# Patient Record
Sex: Male | Born: 1965 | Race: Black or African American | Hispanic: No | Marital: Single | Smoking: Former smoker
Health system: Southern US, Community
[De-identification: ages and names within clinical notes are randomized; demographics above are authoritative.]

## PROBLEM LIST (undated history)

## (undated) DIAGNOSIS — I10 Essential (primary) hypertension: Secondary | ICD-10-CM

## (undated) DIAGNOSIS — F1910 Other psychoactive substance abuse, uncomplicated: Secondary | ICD-10-CM

## (undated) DIAGNOSIS — Z720 Tobacco use: Secondary | ICD-10-CM

---

## 2021-04-16 ENCOUNTER — Emergency Department (HOSPITAL_COMMUNITY): Payer: Self-pay

## 2021-04-16 ENCOUNTER — Observation Stay (HOSPITAL_COMMUNITY)
Admission: EM | Admit: 2021-04-16 | Discharge: 2021-04-17 | Disposition: A | Discharge status: Home / Self Care | Payer: Self-pay | Attending: Internal Medicine | Admitting: Internal Medicine

## 2021-04-16 ENCOUNTER — Encounter (HOSPITAL_COMMUNITY): Payer: Self-pay | Admitting: Emergency Medicine

## 2021-04-16 ENCOUNTER — Other Ambulatory Visit: Payer: Self-pay

## 2021-04-16 DIAGNOSIS — I160 Hypertensive urgency: Principal | ICD-10-CM | POA: Insufficient documentation

## 2021-04-16 DIAGNOSIS — I10 Essential (primary) hypertension: Secondary | ICD-10-CM | POA: Insufficient documentation

## 2021-04-16 DIAGNOSIS — R079 Chest pain, unspecified: Secondary | ICD-10-CM

## 2021-04-16 DIAGNOSIS — I208 Other forms of angina pectoris: Secondary | ICD-10-CM | POA: Insufficient documentation

## 2021-04-16 HISTORY — DX: Other psychoactive substance abuse, uncomplicated: F19.10

## 2021-04-16 HISTORY — DX: Essential (primary) hypertension: I10

## 2021-04-16 HISTORY — DX: Tobacco use: Z72.0

## 2021-04-16 LAB — TROPONIN I (HIGH SENSITIVITY)
Troponin I (High Sensitivity): 20 ng/L — ABNORMAL HIGH (ref <18)
Troponin I (High Sensitivity): 22 ng/L — ABNORMAL HIGH (ref <18)

## 2021-04-16 LAB — CBC
HCT: 42.6 % (ref 39.0–52.0)
Hemoglobin: 13.9 g/dL (ref 13.0–17.0)
MCH: 29.4 pg (ref 26.0–34.0)
MCHC: 32.6 g/dL (ref 30.0–36.0)
MCV: 90.1 fL (ref 80.0–100.0)
Platelets: 244 10*3/uL (ref 150–400)
RBC: 4.73 MIL/uL (ref 4.22–5.81)
RDW: 11.9 % (ref 11.5–15.5)
WBC: 5.8 10*3/uL (ref 4.0–10.5)
nRBC: 0 % (ref 0.0–0.2)

## 2021-04-16 LAB — BASIC METABOLIC PANEL
Anion gap: 11 (ref 5–15)
BUN: 8 mg/dL (ref 6–20)
CO2: 23 mmol/L (ref 22–32)
Calcium: 9.2 mg/dL (ref 8.9–10.3)
Chloride: 104 mmol/L (ref 98–111)
Creatinine, Ser: 1.04 mg/dL (ref 0.61–1.24)
GFR, Estimated: >60 mL/min (ref >60)
Glucose, Bld: 150 mg/dL — ABNORMAL HIGH (ref 70–99)
Potassium: 3.4 mmol/L — ABNORMAL LOW (ref 3.5–5.1)
Sodium: 138 mmol/L (ref 135–145)

## 2021-04-16 LAB — RAPID URINE DRUG SCREEN, HOSP PERFORMED
Amphetamines: NOT DETECTED
Barbiturates: NOT DETECTED
Benzodiazepines: NOT DETECTED
Cocaine: POSITIVE — AB
Opiates: NOT DETECTED
Tetrahydrocannabinol: NOT DETECTED

## 2021-04-16 LAB — LIPID PANEL
Cholesterol: 239 mg/dL — ABNORMAL HIGH (ref 0–200)
HDL: 52 mg/dL (ref >40)
LDL Cholesterol: 176 mg/dL — ABNORMAL HIGH (ref 0–99)
Total CHOL/HDL Ratio: 4.6 RATIO
Triglycerides: 56 mg/dL (ref <150)
VLDL: 11 mg/dL (ref 0–40)

## 2021-04-16 LAB — TSH: TSH: 1.116 u[IU]/mL (ref 0.350–4.500)

## 2021-04-16 LAB — PROTIME-INR
INR: 1 (ref 0.8–1.2)
Prothrombin Time: 12.9 seconds (ref 11.4–15.2)

## 2021-04-16 LAB — HEMOGLOBIN A1C
Hgb A1c MFr Bld: 4.7 % — ABNORMAL LOW (ref 4.8–5.6)
Mean Plasma Glucose: 88.19 mg/dL

## 2021-04-16 LAB — HIV ANTIBODY (ROUTINE TESTING W REFLEX): HIV Screen 4th Generation wRfx: NONREACTIVE

## 2021-04-16 LAB — HEPARIN LEVEL (UNFRACTIONATED): Heparin Unfractionated: <0.1 IU/mL — ABNORMAL LOW (ref 0.30–0.70)

## 2021-04-16 MED ORDER — HEPARIN BOLUS VIA INFUSION
4000.0000 [IU] | Freq: Once | INTRAVENOUS | Status: AC
  Administered 2021-04-16: 4000 [IU] via INTRAVENOUS
  Filled 2021-04-16: qty 4000

## 2021-04-16 MED ORDER — ACETAMINOPHEN 650 MG RE SUPP: 650.0000 mg | Freq: Four times a day (QID) | RECTAL | Status: DC | PRN

## 2021-04-16 MED ORDER — SENNOSIDES-DOCUSATE SODIUM 8.6-50 MG PO TABS: 1.0000 | ORAL_TABLET | Freq: Every evening | ORAL | Status: DC | PRN

## 2021-04-16 MED ORDER — POTASSIUM CHLORIDE CRYS ER 20 MEQ PO TBCR
40.0000 meq | EXTENDED_RELEASE_TABLET | Freq: Once | ORAL | Status: AC
  Administered 2021-04-16: 40 meq via ORAL
  Filled 2021-04-16: qty 2

## 2021-04-16 MED ORDER — HEPARIN (PORCINE) 25000 UT/250ML-% IV SOLN
1200.0000 [IU]/h | INTRAVENOUS | Status: DC
Start: 2021-04-16 — End: 2021-04-16
  Administered 2021-04-16: 1200 [IU]/h via INTRAVENOUS
  Filled 2021-04-16: qty 250

## 2021-04-16 MED ORDER — DILTIAZEM HCL 30 MG PO TABS
30.0000 mg | ORAL_TABLET | Freq: Four times a day (QID) | ORAL | Status: DC
  Administered 2021-04-16 – 2021-04-17 (×4): 30 mg via ORAL
  Filled 2021-04-16 (×5): qty 1

## 2021-04-16 MED ORDER — METOPROLOL TARTRATE 25 MG PO TABS: 25.0000 mg | ORAL_TABLET | Freq: Four times a day (QID) | ORAL | Status: DC

## 2021-04-16 MED ORDER — AMLODIPINE BESYLATE 5 MG PO TABS
10.0000 mg | ORAL_TABLET | Freq: Once | ORAL | Status: AC
  Administered 2021-04-16: 10 mg via ORAL
  Filled 2021-04-16: qty 2

## 2021-04-16 MED ORDER — ACETAMINOPHEN 325 MG PO TABS: 650.0000 mg | ORAL_TABLET | Freq: Four times a day (QID) | ORAL | Status: DC | PRN

## 2021-04-16 NOTE — Progress Notes (Signed)
ANTICOAGULATION CONSULT NOTE - Initial Consult ? ?Pharmacy Consult for Heparin ?Indication: chest pain/ACS ? ?Allergies  ?Allergen Reactions  ? Lactose Intolerance (Gi) Diarrhea  ? ? ?Patient Measurements: ?Height: 5\' 8"  (172.7 cm) ?Weight: 86.2 kg (190 lb) ?IBW/kg (Calculated) : 68.4 ?Heparin Dosing Weight: 85.7 kg ? ?Vital Signs: ?Temp: 97.6 ?F (36.4 ?C) (03/26 0850) ?Temp Source: Oral (03/26 0850) ?BP: 192/109 (03/26 1100) ?Pulse Rate: 86 (03/26 1100) ? ?Labs: ?Recent Labs  ?  04/16/21 ?0856 04/16/21 ?1022  ?HGB 13.9  --   ?HCT 42.6  --   ?PLT 244  --   ?CREATININE 1.04  --   ?TROPONINIHS 20* 22*  ? ? ?Estimated Creatinine Clearance: 86.7 mL/min (by C-G formula based on SCr of 1.04 mg/dL). ? ? ?Medical History: ?Past Medical History:  ?Diagnosis Date  ? Hypertension   ? ? ?Medications:  ?(Not in a hospital admission) ? ?Scheduled:  ? metoprolol tartrate  25 mg Oral Q6H  ? ?Infusions:  ?PRN:  ? ?Assessment: ?30 yom with a history of hypertension, tobacco and marijuana use . Patient is presenting with chest pain. Heparin per pharmacy consult placed for chest pain/ACS. ? ?Patient is not on anticoagulation prior to arrival. ? ?Hgb 13.9; plt 244 ? ?Goal of Therapy:  ?Heparin level 0.3-0.7 units/ml ?Monitor platelets by anticoagulation protocol: Yes ?  ?Plan:  ?Give 4000 units bolus x 1 ?Start heparin infusion at 1200 units/hr ?Check anti-Xa level in 6 hours and daily while on heparin ?Continue to monitor H&H and platelets ? ?Lorelei Pont, PharmD, BCPS ?04/16/2021 11:44 AM ?ED Clinical Pharmacist -  [phone] ? ? ? ?

## 2021-04-16 NOTE — H&P (Addendum)
? ?Date: 04/16/2021     ?Patient Name:  Philip Roth MRN: [id number]  ?DOB: [birth year] Age / Sex: 56 y.o., male   ?PCP: Pcp, No    ?     ?Medical Service: Internal Medicine Teaching Service  ?     ?Attending Physician: Dr. Mercie EonMachen, Julie, MD    ?Intern (1st Contact): Dr. Alfonse Flavorsosby Pager: [phone]  ?Resident (2nd Contact): Merrilyn PumaSagar Jinwala, MD Pager: Nada MaclachlanSJ [phone]  ?     ?After Hours (After 5p/  First Contact Pager: [phone]  ?weekends / holidays): Second Contact Pager: [phone]  ? ?SUBJECTIVE:  ?Chief Complaint: Chest and Left Arm Pain ? ?History of Present Illness: Philip Jacobsyrone Roth is a  56 y.o. male with a pertinent PMH of hypertension and hyperlipidemia, who presents to [facility] with chest and left arm pain. ? ?Philip Roth states that he was in his usual state of health until he began to experience chest tightness with radiation and "tightness" to left arm for the last week.  He states that the symptoms occur from the seated to standing position and typically occur twice a day.  He states that the symptoms do not occur when he is walking long distances. He denies any lightheadedness/dizziness, nausea, vomiting, dyspnea, or abdominal pain during these episodes, and his symptoms typically last up to a minute or 2.  He states that he has never had episodes like this.  He has not tried anything to alleviate the pain, but finds resting beneficial. The only that exacerbates his pain when he stands from a seated position. He denies having the symptoms in the past. ? ?Patient states that he does have a past medical history of hypertension and hyperlipidemia, but has not been compliant with medication for months.  He is unsure what medication is but he believes that his cholesterol medication was HCTZ. ? ?In the ED, the patient was patient was found to be hypertensive with systolics in the 170s, with no tachycardia.  Initial troponin was 20 with a second pending.  UDS pending.  CBC was reassuring, and he was mildly hypokalemic with a  potassium of 3.4.  Patient was started on amlodipine in the emergency department, and I MTS was consulted for admission.  EKG shows severe hypertrophy, and cardiology was consulted in the ED. ? ?Medications: ?No current facility-administered medications on file prior to encounter.  ? ?No current outpatient medications on file prior to encounter.  ? ? ?Past Medical History: ?Past Medical History:  ?Diagnosis Date  ? Hypertension   ? ? ?Social:  ?Lives - [address] ?[place],  [place] [id number],  ?Support -Lives at home alone ?Level of function: He is able to complete all ADLs and IADLs dependently ?Occupation -works at a [employer] ?PCP - Pcp, No ?Substance use: ?Nonprescription/Illicit -occasional marijuana use, snorts cocaine twice weekly, last use yesterday ?ETOH -None ?Tobacco -Current 5-pack-year smoker ? ?Family History: ?Hypertension: Mother ?Diabetes: Mother ?Heart disease: Father ?Stroke: Father ?Cancer: Uncles (prostate) ? ? ?Allergies: ?Allergies as of 04/16/2021 - Review Complete 04/16/2021  ?Allergen Reaction Noted  ? Lactose intolerance (gi) Diarrhea 04/16/2021  ? ? ?Review of Systems: ?A complete ROS was negative except as per HPI.  ? ?OBJECTIVE:  ?Physical Exam: ?Blood pressure (!) 192/109, pulse 86, temperature 97.6 ?F (36.4 ?C), temperature source Oral, resp. rate 17, height 5\' 8"  (1.727 m), weight 86.2 kg, SpO2 100 %. ?Physical Exam ?Vitals reviewed.  ?Constitutional:   ?   General: He is not in acute distress. ?   Appearance:  He is normal weight. He is not ill-appearing.  ?   Comments: Resting comfortably in bed, no acute distress, answers questions appropriately  ?Cardiovascular:  ?   Rate and Rhythm: Normal rate.  ?   Pulses: Normal pulses.  ?   Heart sounds: Normal heart sounds. No murmur heard. ?  No friction rub. No gallop.  ?Pulmonary:  ?   Effort: Pulmonary effort is normal.  ?   Breath sounds: Normal breath sounds. No wheezing, rhonchi or rales.  ?Abdominal:  ?   General: Bowel  sounds are normal. There is no distension.  ?Musculoskeletal:  ?   Right lower leg: No edema.  ?   Left lower leg: No edema.  ?Neurological:  ?   Mental Status: He is alert and oriented to person, place, and time.  ?Psychiatric:     ?   Behavior: Behavior normal.  ? ? ?Pertinent Labs: ?CBC ?   ?Component Value Date/Time  ? WBC 5.8 04/16/2021 0856  ? RBC 4.73 04/16/2021 0856  ? HGB 13.9 04/16/2021 0856  ? HCT 42.6 04/16/2021 0856  ? PLT 244 04/16/2021 0856  ? MCV 90.1 04/16/2021 0856  ? MCH 29.4 04/16/2021 0856  ? MCHC 32.6 04/16/2021 0856  ? RDW 11.9 04/16/2021 0856  ?  ? ?CMP  ?   ?Component Value Date/Time  ? NA 138 04/16/2021 0856  ? K 3.4 (L) 04/16/2021 0856  ? CL 104 04/16/2021 0856  ? CO2 23 04/16/2021 0856  ? GLUCOSE 150 (H) 04/16/2021 0856  ? BUN 8 04/16/2021 0856  ? CREATININE 1.04 04/16/2021 0856  ? CALCIUM 9.2 04/16/2021 0856  ? GFRNONAA >60 04/16/2021 0856  ? ? ?Pertinent Imaging: ?DG Chest 2 View ? ?Result Date: 04/16/2021 ?CLINICAL DATA:  Pt reports chest tightness, L arm tightness, and generalized weakness x 2 days. Denies SOB, nausea, and vomiting EXAM: CHEST - 2 VIEW COMPARISON:  None. FINDINGS: Normal heart, mediastinum and hila. Clear lungs.  No pleural effusion or pneumothorax. Skeletal structures are intact. IMPRESSION: No active cardiopulmonary disease. Electronically Signed   By: Amie Portland M.D.   On: 04/16/2021 09:59   ? ?EKG: Tachycardic with left ventricular hypertrophy ? ?ASSESSMENT & PLAN:  ?Patient Summary: ?56 year old male with past medical history of hypertension hyperlipidemia presenting for chest pain, admitted for hypertensive urgency management. ? ?Assessment: ?Principal Problem: ?  Hypertensive urgency ? ? ?Plan: ?#Hypertensive Urgency in the Setting of Cocaine Use and Noncompliance with Home Medications ?#Cocaine Use ?#Atypical Chest Pain: ?Patient presents with chest pain that worsens from sitting to standing for the past week. Initial troponin is 20 with repeat of 22.   Systolic pressures in the 170s to 190s, with positive drug screen for cocaine and noncompliance to medications per month likely contributing. Cardiology was consulted in the ED. He was started on a heparin drip and metoprolol was ordered.  Given the positive drug screen, BBs are contraindicated. Spoke with Dr. Graciela Husbands about the patient's presentation, and his symptoms aligns more with atypical chest pain and angina. ?- Appreciate cardiology recommendations ?- Avoid beta-blockers in the setting of positive cocaine UDS ?- Obtain echo ?- Admit to progressive with cardiac monitoring ?- Started on Cardizem 30 mg every 6 hours ?- TSH ?- Recommended cessation of cocaine ? ?#Hyperlipidemia: ?Patient states that he has a history of hyperlipidemia movement compliant with his medications.  He is unsure what medication he is taking in the outpatient setting.  Given history of hyperlipidemia and hypertension, will additionally screen for A1c. ?-  Lipid panel ?- A1c ? ? ?Best Practice: ?Diet: Cardiac diet ? ?VTE: heparin bolus via infusion 4,000 Units Start: 04/16/21 1200 ?Code: Full Code ?Dispo: Observation with expected length of stay less than 2 midnights. ?Anticipated Discharge Location: Home ?Barriers to Discharge: Medical stability ? ?Signature: ?Dolan Amen, MD ?Internal Medicine Resident, PGY-3 ?[facility]  ?11:54 AM, 04/16/2021  ? ?Please contact the on-call pager after 5 pm and on weekends at [phone].  ?

## 2021-04-16 NOTE — ED Triage Notes (Signed)
Pt reports chest tightness, L arm tightness, and generalized weakness x 2 days.  Denies SOB, nausea, and vomiting. ?

## 2021-04-16 NOTE — ED Notes (Signed)
Cardiologist at bedside.  

## 2021-04-16 NOTE — Consult Note (Addendum)
?Cardiology Consultation:  ? ?Patient ID: Philip Roth ?MRN: [id number]; DOB: [birth year] ? ?Admit date: 04/16/2021 ?Date of Consult: 04/16/2021 ? ?PCP:  Pcp, No ?  ?[facility] Providers ?Cardiologist:  New ? ?Patient Profile:  ? ?Philip Roth is a 56 y.o. male with a hx of hypertension (noncompliant) and polysubstance abuse (tobacco, marijuana and cocaine) who is being seen 04/16/2021 for the evaluation of chest pain at the request of Dr. Lafonda Mosses. ? ?History of Present Illness:  ? ?Mr. Goeken has not been taking his antihypertensive for months.  Reports 1 week history of intermittent chest pain across his chest.  Describes sharp pressure in character.  Reports left arm tightness.  Occasional shortness of breath or palpitation.  Nothing makes better or worse.  Smokes 1 pack of cigarette every day.  Occasional marijuana smoking.  Last use of cocaine yesterday.  No orthopnea, PND, syncope, dizziness or melena. ? ?On arrival to emergency room, patient found hypertensive at 199/109.  Given amlodipine 10 mg. ? ?High-sensitivity troponin 20>> 22. ?Potassium 3.4 ?Glucose 150 ?Creatinine normal ?Urine drug screen positive for cocaine ?Chest x-ray without acute cardiopulmonary disease ? ?Past Medical History:  ?Diagnosis Date  ? Hypertension   ? Polysubstance abuse (HCC)   ? Tobacco abuse   ? ? ?History reviewed. No pertinent surgical history.  ? ?Inpatient Medications: ?Scheduled Meds: ? diltiazem  30 mg Oral Q6H  ? metoprolol tartrate  25 mg Oral Q6H  ? ?Continuous Infusions: ? heparin 1,200 Units/hr (04/16/21 1332)  ? ?PRN Meds: ?acetaminophen **OR** acetaminophen, senna-docusate ? ?Allergies:    ?Allergies  ?Allergen Reactions  ? Lactose Intolerance (Gi) Diarrhea  ? ? ?Social History:   ?Social History  ? ?Socioeconomic History  ? Marital status: Single  ?  Spouse name: Not on file  ? Number of children: Not on file  ? Years of education: Not on file  ? Highest education level: Not on file  ?Occupational History  ?  Not on file  ?Tobacco Use  ? Smoking status: Every Day  ?  Types: Cigarettes  ? Smokeless tobacco: Never  ?Substance and Sexual Activity  ? Alcohol use: Not Currently  ? Drug use: Yes  ?  Types: Marijuana  ? Sexual activity: Not on file  ?Other Topics Concern  ? Not on file  ?Social History Narrative  ? Not on file  ? ?Social Determinants of Health  ? ?Financial Resource Strain: Not on file  ?Food Insecurity: Not on file  ?Transportation Needs: Not on file  ?Physical Activity: Not on file  ?Stress: Not on file  ?Social Connections: Not on file  ?Intimate Partner Violence: Not on file  ?  ?Family History:   ?Family History  ?Problem Relation Age of Onset  ? Stroke Mother   ?  ? ?ROS:  ?Please see the history of present illness.  ?All other ROS reviewed and negative.    ? ?Physical Exam/Data:  ? ?Vitals:  ? 04/16/21 1245 04/16/21 1300 04/16/21 1315 04/16/21 1335  ?BP: (!) 180/105 (!) 187/112 (!) 180/97   ?Pulse: 82 77 66   ?Resp: 19 13 15    ?Temp:    97.8 ?F (36.6 ?C)  ?TempSrc:    Oral  ?SpO2: 99% 99% 98%   ?Weight:      ?Height:      ? ?No intake or output data in the 24 hours ending 04/16/21 1340 ? ?  04/16/2021  ? 10:03 AM  ?Last 3 Weights  ?Weight (lbs) 190 lb  ?Weight (kg)  86.183 kg  ?   ?Body mass index is 28.89 kg/m?.  ?General:  Well nourished, well developed, in no acute distress ?HEENT: normal ?Neck: no JVD ?Vascular: No carotid bruits; Distal pulses 2+ bilaterally ?Cardiac:  normal S1, S2; RRR; no murmur  ?Lungs:  clear to auscultation bilaterally, no wheezing, rhonchi or rales  ?Abd: soft, nontender, no hepatomegaly  ?Ext: no edema ?Musculoskeletal:  No deformities, BUE and BLE strength normal and equal ?Skin: warm and dry  ?Neuro:  CNs 2-12 intact, no focal abnormalities noted ?Psych:  Normal affect  ? ?EKG:  The EKG was personally reviewed and demonstrates: Sinus tachycardia, heart rate 109 bpm, VH, repolarization abnormality ?Telemetry:  Telemetry was personally reviewed and demonstrates:  Sinus rhythm  at controlled rate  ? ?Relevant CV Studies: ?As above  ? ?Laboratory Data: ? ?High Sensitivity Troponin:   ?Recent Labs  ?Lab 04/16/21 ?0856 04/16/21 ?1022  ?TROPONINIHS 20* 22*  ?   ?Chemistry ?Recent Labs  ?Lab 04/16/21 ?0856  ?NA 138  ?K 3.4*  ?CL 104  ?CO2 23  ?GLUCOSE 150*  ?BUN 8  ?CREATININE 1.04  ?CALCIUM 9.2  ?GFRNONAA >60  ?ANIONGAP 11  ?  ?Hematology ?Recent Labs  ?Lab 04/16/21 ?0856  ?WBC 5.8  ?RBC 4.73  ?HGB 13.9  ?HCT 42.6  ?MCV 90.1  ?MCH 29.4  ?MCHC 32.6  ?RDW 11.9  ?PLT 244  ? ? ?Radiology/Studies:  ?DG Chest 2 View ? ?Result Date: 04/16/2021 ?CLINICAL DATA:  Pt reports chest tightness, L arm tightness, and generalized weakness x 2 days. Denies SOB, nausea, and vomiting EXAM: CHEST - 2 VIEW COMPARISON:  None. FINDINGS: Normal heart, mediastinum and hila. Clear lungs.  No pleural effusion or pneumothorax. Skeletal structures are intact. IMPRESSION: No active cardiopulmonary disease. Electronically Signed   By: Amie Portland M.D.   On: 04/16/2021 09:59   ? ? ?Assessment and Plan:  ? ?Chest pain in setting of hypertensive urgency and cocaine use ?-His symptoms atypical of angina.  Troponin flat, not in ACS pattern. ?-Currently patient does not need IV heparin ?-Cycle troponin, if significantly elevated will start heparin ?-Avoid beta-blocker in setting of cocaine use ?-Get echocardiogram ?-Check lipid panel and hemoglobin A1c ?-Start aspirin 81 mg daily ?-Start low-dose statin ? ?2.  Hypertensive urgency ?-Noncompliant with antihypertensive ?-Will start Cardizem 30 mg every 6 hours for immediate response ?-Continue amlodipine for now ?-Avoid beta-blocker secondary to cocaine use ? ?3.  Polysubstance abuse ?-Recommended cessation ? ?4.  Hypokalemia ?-Potassium 3.4 ?-Will give Kdur x 1  ? ? ?Risk Assessment/Risk Scores:  ? ?HEAR Score (for undifferentiated chest pain):  HEAR Score: 2{ ? ? ?For questions or updates, please contact [facility] ?Please consult [email or web address] for contact info under   ? ? ?Signed, ?Manson Passey, [place]  ?04/16/2021 1:40 PM  ? ? ?Chest pain atypical-- suspect musculoskeletal ? ?Hypertensive urgency ? ?Polysubstance abuse ? ?ECG showing left ventricular hypertrophy with repolarization abnormalities-- ? ?The patient's chest pain is atypical.  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  Pain is aggravated by forced adduction of his right arm and his left arm across his chest.  There are some chest wall tenderness.  Aggravated by sitting up or lying down, the motion as opposed to the position. ? ?This is in line with his troponins.  With his cocaine history, would avoid beta-blockers.  Do not think he needs nitroglycerin. ? ?With his abnormal electrocardiogram, would obtain  an echocardiogram to make sure he does not have hypertrophic cardiomyopathy ? ?Encourage compliance with his blood pressure medications ? ?We will sign off and we are available again as needed ? ? ? ?

## 2021-04-16 NOTE — ED Provider Notes (Addendum)
?[facility] EMERGENCY DEPARTMENT ?Provider Note ? ? ?CSN: [id number] ?Arrival date & time: 04/16/21  0845 ? ?  ? ?History ? ?Chief Complaint  ?Patient presents with  ? Chest Pain  ? ? ?Glyn Gerads is a 56 y.o. male. ? ?Patient is a 56 year old male with a history of hypertension, tobacco and marijuana use who has not taken blood pressure medications in quite some time presenting today with chest pain.  Patient reports for the last 3 days he has been getting intermittent chest pain seems to occur with exertion.  He reports that when he is up walking around which he has been doing a lot recently he will get a heavy discomfort in his chest that has started radiating into his left shoulder and back and then today and went down into his left arm.  He feels like he is going to pass out when that the chest pain comes on and sometimes takes a few hours to go away.  The last episode he had of chest pain was this morning which is the one that went into his left arm.  He has been pain-free for at least 30 to 40 minutes.  He is already taken 3 baby aspirin this morning.  He denies cough, congestion, fever ? ?The history is provided by the patient.  ?Chest Pain ? ?  ? ?Home Medications ?Prior to Admission medications   ?Not on File  ?   ? ?Allergies    ?Milk-related compounds   ? ?Review of Systems   ?Review of Systems  ?Cardiovascular:  Positive for chest pain.  ? ?Physical Exam ?Updated Vital Signs ?BP (!) 178/105   Pulse 81   Temp 97.6 ?F (36.4 ?C) (Oral)   Resp 19   Ht 5\' 8"  (1.727 m)   Wt 86.2 kg   SpO2 100%   BMI 28.89 kg/m?  ?Physical Exam ?Vitals and nursing note reviewed.  ?Constitutional:   ?   General: He is not in acute distress. ?   Appearance: He is well-developed.  ?HENT:  ?   Head: Normocephalic and atraumatic.  ?Eyes:  ?   Conjunctiva/sclera: Conjunctivae normal.  ?   Pupils: Pupils are equal, round, and reactive to light.  ?Cardiovascular:  ?   Rate and Rhythm: Normal rate and regular  rhythm.  ?   Pulses: Normal pulses.  ?   Heart sounds: No murmur heard. ?Pulmonary:  ?   Effort: Pulmonary effort is normal. No respiratory distress.  ?   Breath sounds: Normal breath sounds. No wheezing or rales.  ?Chest:  ?   Chest wall: No tenderness.  ?Abdominal:  ?   General: There is no distension.  ?   Palpations: Abdomen is soft.  ?   Tenderness: There is no abdominal tenderness. There is no guarding or rebound.  ?Musculoskeletal:     ?   General: No tenderness. Normal range of motion.  ?   Cervical back: Normal range of motion and neck supple.  ?Skin: ?   General: Skin is warm and dry.  ?   Findings: No erythema or rash.  ?Neurological:  ?   Mental Status: He is alert and oriented to person, place, and time. Mental status is at baseline.  ?Psychiatric:     ?   Mood and Affect: Mood normal.     ?   Behavior: Behavior normal.  ? ? ?ED Results / Procedures / Treatments   ?Labs ?(all labs ordered are listed, but only abnormal  results are displayed) ?Labs Reviewed  ?BASIC METABOLIC PANEL - Abnormal; Notable for the following components:  ?    Result Value  ? Potassium 3.4 (*)   ? Glucose, Bld 150 (*)   ? All other components within normal limits  ?TROPONIN I (HIGH SENSITIVITY) - Abnormal; Notable for the following components:  ? Troponin I (High Sensitivity) 20 (*)   ? All other components within normal limits  ?CBC  ?RAPID URINE DRUG SCREEN, HOSP PERFORMED  ?TROPONIN I (HIGH SENSITIVITY)  ? ? ?EKG ?EKG Interpretation ? ?Date/Time:  Sunday April 16 2021 08:39:18 EDT ?Ventricular Rate:  109 ?PR Interval:  144 ?QRS Duration: 94 ?QT Interval:  350 ?QTC Calculation: 471 ?R Axis:   53 ?Text Interpretation: Sinus tachycardia Left ventricular hypertrophy with repolarization abnormality ( Sokolow-Lyon , Romhilt-Estes ) No previous ECGs available Confirmed by Gwyneth Sprout ([id number]) on 04/16/2021 9:28:04 AM ? ?Radiology ?DG Chest 2 View ? ?Result Date: 04/16/2021 ?CLINICAL DATA:  Pt reports chest tightness, L arm  tightness, and generalized weakness x 2 days. Denies SOB, nausea, and vomiting EXAM: CHEST - 2 VIEW COMPARISON:  None. FINDINGS: Normal heart, mediastinum and hila. Clear lungs.  No pleural effusion or pneumothorax. Skeletal structures are intact. IMPRESSION: No active cardiopulmonary disease. Electronically Signed   By: Amie Portland M.D.   On: 04/16/2021 09:59   ? ?Procedures ?Procedures  ? ? ?Medications Ordered in ED ?Medications  ?amLODipine (NORVASC) tablet 10 mg (10 mg Oral Given 04/16/21 1036)  ? ? ?ED Course/ Medical Decision Making/ A&P ?  ?                        ?Medical Decision Making ?Amount and/or Complexity of Data Reviewed ?Labs: ordered. Decision-making details documented in ED Course. ?Radiology: ordered and independent interpretation performed. Decision-making details documented in ED Course. ?ECG/medicine tests: ordered and independent interpretation performed. Decision-making details documented in ED Course. ? ?Risk ?Prescription drug management. ?Decision regarding hospitalization. ? ? ?Patient is a 56 year old male presenting today with symptoms concerning for stable angina.  The symptoms have been present for the last 3 days in the setting of untreated hypertension, tobacco and marijuana use.  Patient has not seen a doctor regularly and sounds like he is most likely been off medication for approximately 4 years.  He has no prior heart history.  Currently he is pain-free and in no acute distress but continues to be hypertensive.  I independently interpreted patient's EKG and labs.  His EKG today shows mild sinus tachycardia with evidence of LVH but no frank ST elevation consistent with STEMI.  CBC, BMP are within normal limits and troponin is mildly elevated at 20.  I independently visualized and interpreted patient's chest x-ray which is negative.  Patient remains pain-free.  He was given oral amlodipine and he is already taken aspirin today.  Given patient's risk factors and concerning  history feel that he needs admission for chest pain work-up and blood pressure control.  Spoke with internal medicine resident for admission.  We will also consult cardiology. ? ?11:22 AM ?Spoke with cardiology who recommended that we go ahead and start heparin drip and also recommended metoprolol.  They feel that the patient will most likely need catheterization but does need blood pressure control in the meantime.  They are happy to consult on him. ?Patient was on continuous monitoring throughout his stay without evidence of dysrhythmia.  He remains pain-free at this time. ? ? ?CRITICAL CARE ?Performed by:  Aryan Bello ?Total critical care time: 30 minutes ?Critical care time was exclusive of separately billable procedures and treating other patients. ?Critical care was necessary to treat or prevent imminent or life-threatening deterioration. ?Critical care was time spent personally by me on the following activities: development of treatment plan with patient and/or surrogate as well as nursing, discussions with consultants, evaluation of patient's response to treatment, examination of patient, obtaining history from patient or surrogate, ordering and performing treatments and interventions, ordering and review of laboratory studies, ordering and review of radiographic studies, pulse oximetry and re-evaluation of patient's condition. ? ? ? ? ? ?Final Clinical Impression(s) / ED Diagnoses ?Final diagnoses:  ?Stable angina (HCC)  ?Hypertensive urgency  ? ? ?Rx / DC Orders ?ED Discharge Orders   ? ? None  ? ?  ? ? ?  ?Gwyneth SproutPlunkett, Eduar Kumpf, MD ?04/16/21 1059 ? ?  ?Gwyneth SproutPlunkett, Ceasar Decandia, MD ?04/16/21 1123 ? ?

## 2021-04-17 ENCOUNTER — Other Ambulatory Visit (HOSPITAL_COMMUNITY): Payer: Self-pay

## 2021-04-17 ENCOUNTER — Observation Stay (HOSPITAL_BASED_OUTPATIENT_CLINIC_OR_DEPARTMENT_OTHER): Payer: Self-pay

## 2021-04-17 DIAGNOSIS — I160 Hypertensive urgency: Secondary | ICD-10-CM

## 2021-04-17 DIAGNOSIS — R079 Chest pain, unspecified: Secondary | ICD-10-CM

## 2021-04-17 DIAGNOSIS — I208 Other forms of angina pectoris: Secondary | ICD-10-CM

## 2021-04-17 LAB — ECHOCARDIOGRAM COMPLETE
Area-P 1/2: 2.76 cm2
Calc EF: 46.1 %
Height: 68 in
S' Lateral: 3.5 cm
Single Plane A2C EF: 46 %
Single Plane A4C EF: 48.1 %
Weight: 2299.2 oz

## 2021-04-17 LAB — BASIC METABOLIC PANEL
Anion gap: 3 — ABNORMAL LOW (ref 5–15)
BUN: 13 mg/dL (ref 6–20)
CO2: 26 mmol/L (ref 22–32)
Calcium: 9.2 mg/dL (ref 8.9–10.3)
Chloride: 109 mmol/L (ref 98–111)
Creatinine, Ser: 1.23 mg/dL (ref 0.61–1.24)
GFR, Estimated: >60 mL/min (ref >60)
Glucose, Bld: 84 mg/dL (ref 70–99)
Potassium: 4.1 mmol/L (ref 3.5–5.1)
Sodium: 138 mmol/L (ref 135–145)

## 2021-04-17 LAB — CBC
HCT: 42.2 % (ref 39.0–52.0)
Hemoglobin: 13.9 g/dL (ref 13.0–17.0)
MCH: 29.8 pg (ref 26.0–34.0)
MCHC: 32.9 g/dL (ref 30.0–36.0)
MCV: 90.4 fL (ref 80.0–100.0)
Platelets: 229 10*3/uL (ref 150–400)
RBC: 4.67 MIL/uL (ref 4.22–5.81)
RDW: 12.1 % (ref 11.5–15.5)
WBC: 6 10*3/uL (ref 4.0–10.5)
nRBC: 0 % (ref 0.0–0.2)

## 2021-04-17 MED ORDER — ASPIRIN 81 MG PO CHEW
81.0000 mg | CHEWABLE_TABLET | Freq: Every day | ORAL | 0 refills | Status: DC
  Filled 2021-04-17: qty 30, 30d supply, fill #0

## 2021-04-17 MED ORDER — DILTIAZEM HCL 30 MG PO TABS
30.0000 mg | ORAL_TABLET | Freq: Four times a day (QID) | ORAL | Status: DC
  Administered 2021-04-17: 30 mg via ORAL
  Filled 2021-04-17: qty 1

## 2021-04-17 MED ORDER — DILTIAZEM HCL ER COATED BEADS 120 MG PO CP24
120.0000 mg | ORAL_CAPSULE | Freq: Every day | ORAL | 0 refills | Status: DC
  Filled 2021-04-17: qty 30, 30d supply, fill #0

## 2021-04-17 MED ORDER — DILTIAZEM HCL ER COATED BEADS 120 MG PO CP24: 120.0000 mg | ORAL_CAPSULE | Freq: Every day | ORAL | Status: DC

## 2021-04-17 MED ORDER — ASPIRIN 81 MG PO CHEW
81.0000 mg | CHEWABLE_TABLET | Freq: Every day | ORAL | Status: DC
  Administered 2021-04-17: 81 mg via ORAL
  Filled 2021-04-17: qty 1

## 2021-04-17 MED ORDER — ATORVASTATIN CALCIUM 10 MG PO TABS
20.0000 mg | ORAL_TABLET | Freq: Every day | ORAL | Status: DC
  Administered 2021-04-17: 20 mg via ORAL
  Filled 2021-04-17: qty 2

## 2021-04-17 MED ORDER — ATORVASTATIN CALCIUM 20 MG PO TABS
20.0000 mg | ORAL_TABLET | Freq: Every day | ORAL | 0 refills | Status: DC
  Filled 2021-04-17: qty 30, 30d supply, fill #0

## 2021-04-17 NOTE — TOC Transition Note (Addendum)
Transition of Care (TOC) - CM/SW Discharge Note ? ? ?Patient Details  ?Name: Satchel Heidinger ?MRN: [id number] ?Date of Birth: Mar 28, 1965 ? ?Transition of Care (TOC) CM/SW Contact:  ?Leone Haven, RN ?Phone Number: ?04/17/2021, 12:59 PM ? ? ?Clinical Narrative:    ?Patient is for dc today, TOC will fill meds for patient.  NCM will assist with Match Letter , patient states he does not have any funds. He states he has his car here at the hospital and will drive himself home today.  He has a follow up apt on the AVS. NCM gave patient SA resources.  ? ? ?  ?  ? ? ?Patient Goals and CMS Choice ?  ?  ?  ? ?Discharge Placement ?  ?           ?  ?  ?  ?  ? ?Discharge Plan and Services ?  ?  ?           ?  ?  ?  ?  ?  ?  ?  ?  ?  ?  ? ?Social Determinants of Health (SDOH) Interventions ?  ? ? ?Readmission Risk Interventions ?   ? View : No data to display.  ?  ?  ?  ? ? ? ? ? ?

## 2021-04-17 NOTE — TOC Progression Note (Signed)
Transition of Care (TOC) - Progression Note  ? ? ?Patient Details  ?Name: Philip Roth ?MRN: [id number] ?Date of Birth: [birth year] ? ?Transition of Care (TOC) CM/SW Contact  ?Zenon Mayo, RN ?Phone Number: ?04/17/2021, 10:51 AM ? ?Clinical Narrative:    ?from home, chest pain, htn urgency, noncompliant with meds, indep.   No insurance on file and no PCP on file.  Secretary to get patient schedule with  [facility] clinic.  He may need assist with meds at dc. TOC will continue to follow for dc needs.  ? ? ?  ?  ? ?Expected Discharge Plan and Services ?  ?  ?  ?  ?  ?                ?  ?  ?  ?  ?  ?  ?  ?  ?  ?  ? ? ?Social Determinants of Health (SDOH) Interventions ?  ? ?Readmission Risk Interventions ?   ? View : No data to display.  ?  ?  ?  ? ? ?

## 2021-04-17 NOTE — Discharge Summary (Signed)
? ?Name: Philip Roth ?MRN: [id number] ?DOB: 1965-11-22 56 y.o. ?PCP: Pcp, No ? ?Date of Admission: 04/16/2021  8:50 AM ?Date of Discharge: 04/17/2021 10:00 AM ?Attending Physician: Mercie Eon, MD ? ?Discharge Diagnosis: ?1. Hypertensive urgency, resolved ?2. Cocaine Use Disorder ?3. Medication non-compliance ?4. HLD ? ?Discharge Medications: ?Allergies as of 04/17/2021   ? ?   Reactions  ? Lactose Intolerance (gi) Diarrhea  ? ?  ? ?  ?Medication List  ?  ? ?STOP taking these medications   ? ?aspirin 325 MG EC tablet ?Replaced by: Aspirin Low Dose 81 MG chewable tablet ?  ? ?  ? ?TAKE these medications   ? ?Aspirin Low Dose 81 MG chewable tablet ?Generic drug: aspirin ?Chew 1 tablet (81 mg total) by mouth daily. ?Replaces: aspirin 325 MG EC tablet ?  ?atorvastatin 20 MG tablet ?Commonly known as: LIPITOR ?Take 1 tablet (20 mg total) by mouth daily. ?  ?Cartia XT 120 MG 24 hr capsule ?Generic drug: diltiazem ?Take 1 capsule (120 mg total) by mouth daily. ?  ? ?  ? ? ?Disposition and follow-up:   ?Philip Roth was discharged from [facility] in Stable condition.  At the hospital follow up visit please address: ? ?1.  Hypertension- Initiated on Diltiazem in hospital, worked for patient to bring BP down. Consider switching to Lisinopril or similar agent. Patient with cocaine use disorder so BB contraindicated.  ? ?2.  Labs / imaging needed at time of follow-up: N/A ? ?3.  Pending labs/ test needing follow-up: N/A ? ?Follow-up Appointments: ? Follow-up Information   ? ? [facility]. Go on 04/27/2021.   ?Why: @11 :10 ?Contact information: ?[address] ?[place] ?[phone] ? ?  ?  ? ?  ?  ? ?  ? ? ? ?Hospital Course by problem list: ?#Hypertensive Urgency in the Setting of Cocaine Use and Noncompliance with Home Medications, resolved ?#Atypical Chest Pain: ?Patient presented with chest pain that worsened from sitting to  standing for the past week. Initial troponin was 20 with repeat of 22.  Systolic pressures on admission were in the 170s to 190s, with positive drug screen for cocaine and noncompliance with home antihypertensive regimen, both of which likely contributed.  In the ED, patient was started on a heparin drip and metoprolol was ordered.  Given the positive drug screen, metoprolol immediately continued.  Initiated on diltiazem 30 mg every 6 hours.  Telemetry with NSR.  Echo with LVEF 50 to 55%, grade 1 diastolic dysfunction, mild LVH.  Discharge medication: Diltiazem 30 mg x 2 doses then transition to diltiazem XT 120 mg daily, ASA 81 mg. ? ?#Cocaine Use Disorder ?Patient counseled on complete cessation of use and was asked if he needed assistance with discontinuation, which he affirmed.  TOC provided SA resources prior to discharge. ? ?#Hyperlipidemia: ?Patient reported a history of hyperlipidemia non-compliant with his medications.  Total cholesterol 239, LDL 176.  Discharge medication: Atorvastatin 20 mg daily. ? ?Discharge Subjective: ?Patient reports feeling much better today with complete resolution of L arm tightness/pain and significant. ? ?Discharge Exam:   ?BP (!) 145/77 (BP Location: Left Arm)   Pulse (!) 59   Temp (!) 97.5 ?F (36.4 ?C) (Oral)   Resp 16   Ht 5\' 8"  (1.727 m)   Wt 65.2 kg   SpO2 97%   BMI 21.85 kg/m?  ?Discharge exam:  ?Physical Exam: ?General: Well-appearing middle aged male in NAD; initially  ambulating from restroom without issue. ?HENT: normocephalic, atraumatic, external nares and ears appear normal ?EYES: conjunctiva non-erythematous, no scleral icterus ?CV: regular rate, normal rhythm, no murmurs, rubs, gallops. Non-edematous LE. ?Pulmonary: normal work of breathing on RA, lungs clear to auscultation bilaterally ?Abdominal: non-distended, soft, non-tender to palpation, normal BS all 4 quadrants ?Skin: Warm and dry ?Neurological: Awake, alert and oriented x3. ?Psych: normal affect and  behavior  ? ?Pertinent Labs, Studies, and Procedures:  ? ?DG Chest 2 View ? ?Result Date: 04/16/2021 ?CLINICAL DATA:  Pt reports chest tightness, L arm tightness, and generalized weakness x 2 days. Denies SOB, nausea, and vomiting EXAM: CHEST - 2 VIEW COMPARISON:  None. FINDINGS: Normal heart, mediastinum and hila. Clear lungs.  No pleural effusion or pneumothorax. Skeletal structures are intact. IMPRESSION: No active cardiopulmonary disease. Electronically Signed   By: Amie Portlandavid  Ormond M.D.   On: 04/16/2021 09:59  ?  ?ECHOCARDIOGRAM REPORT ?    ?IMPRESSIONS  ? 1. Left ventricular ejection fraction, by estimation, is 50 to 55%. The  ?left ventricle has low normal function. The left ventricle has no regional  ?wall motion abnormalities. There is mild left ventricular hypertrophy.  ?Left ventricular diastolic  ?parameters are consistent with Grade I diastolic dysfunction (impaired  ?relaxation).  ? 2. Right ventricular systolic function is normal. The right ventricular  ?size is normal.  ? 3. The mitral valve is normal in structure. Trivial mitral valve  ?regurgitation. No evidence of mitral stenosis.  ? 4. The aortic valve is tricuspid. Aortic valve regurgitation is not  ?visualized. Aortic valve sclerosis is present, with no evidence of aortic  ?valve stenosis.  ? 5. The inferior vena cava is normal in size with greater than 50%  ?respiratory variability, suggesting right atrial pressure of 3 mmHg. ? ?FINDINGS  ? Left Ventricle: Left ventricular ejection fraction, by estimation, is 50  ?to 55%. The left ventricle has low normal function. The left ventricle has  ?no regional wall motion abnormalities. The left ventricular internal  ?cavity size was normal in size.  ?There is mild left ventricular hypertrophy. Left ventricular diastolic  ?parameters are consistent with Grade I diastolic dysfunction (impaired  ?relaxation).  ? ?Right Ventricle: The right ventricular size is normal. Right ventricular  ?systolic function is  normal.  ? ?Left Atrium: Left atrial size was normal in size.  ? ?Right Atrium: Right atrial size was normal in size.  ? ?Pericardium: There is no evidence of pericardial effusion.  ? ?Mitral Valve: The mitral valve is normal in structure. Trivial mitral  ?valve regurgitation. No evidence of mitral valve stenosis.  ? ?Tricuspid Valve: The tricuspid valve is normal in structure. Tricuspid  ?valve regurgitation is trivial. No evidence of tricuspid stenosis.  ? ?Aortic Valve: The aortic valve is tricuspid. Aortic valve regurgitation is  ?not visualized. Aortic valve sclerosis is present, with no evidence of  ?aortic valve stenosis.  ? ?Pulmonic Valve: The pulmonic valve was normal in structure. Pulmonic valve  ?regurgitation is not visualized. No evidence of pulmonic stenosis.  ? ?Aorta: The aortic root is normal in size and structure.  ? ?Venous: The inferior vena cava is normal in size with greater than 50%  ?respiratory variability, suggesting right atrial pressure of 3 mmHg.  ? ?IAS/Shunts: No atrial level shunt detected by color flow Doppler.  ? ?   ?LEFT VENTRICLE  ?PLAX 2D  ?LVIDd:         4.60 cm      Diastology  ?LVIDs:  3.50 cm      LV e' medial:    5.43 cm/s  ?LV PW:         1.40 cm      LV E/e' medial:  13.2  ?LV IVS:        1.10 cm      LV e' lateral:   10.10 cm/s  ?LVOT diam:     1.70 cm      LV E/e' lateral: 7.1  ?LV SV:         42  ?LV SV Index:   23  ?LVOT Area:     2.27 cm?  ?   ?LV Volumes (MOD)  ?LV vol d, MOD A2C: 76.3 ml  ?LV vol d, MOD A4C: 114.0 ml  ?LV vol s, MOD A2C: 41.2 ml  ?LV vol s, MOD A4C: 59.2 ml  ?LV SV MOD A2C:     35.1 ml  ?LV SV MOD A4C:     114.0 ml  ?LV SV MOD BP:      44.1 ml  ? ?RIGHT VENTRICLE             IVC  ?RV S prime:     10.60 cm/s  IVC diam: 0.90 cm  ?TAPSE (M-mode): 1.7 cm  ? ?LEFT ATRIUM             Index        RIGHT ATRIUM          Index  ?LA diam:        3.60 cm 2.03 cm/m?   RA Area:     9.35 cm?  ?LA Vol (A2C):   52.6 ml 29.62 ml/m?  RA Volume:   19.70 ml  11.09 ml/m?  ?LA Vol (A4C):   37.3 ml 21.00 ml/m?  ?LA Biplane Vol: 44.4 ml 25.00 ml/m?  ? AORTIC VALVE  ?LVOT Vmax:   90.20 cm/s  ?LVOT Vmean:  61.900 cm/s  ?LVOT VTI:    0.183 m  ?   ?AORTA  ?Ao Root diam: 2.70 cm

## 2021-04-17 NOTE — Discharge Instructions (Addendum)
Dear Philip Roth, ?I am so glad you are feeling better and can be discharged today (04/17/2021)! You were admitted for high blood pressure >180/120 (hypertensive urgency).  ? ?Please see the following instructions: ?Please see your primary care / family doctor for follow-up on your blood pressure as well as your other medical issues, concerns and/or health care needs. ?NEW meds:  ?Diltiazem 30 mg for 2 more doses today (3 PM and at bedtime) then switch to Diltiazem 24 hr 120 mg once daily. ?Atorvastatin 20 mg for your high cholesterol ?Aspirin 81 mg daily for chest pain and stroke prevention. ?STOP meds:  ?Aspirin 325 mg daily. ? ?Report any adverse effects and or reactions from the medicines to your outpatient provider promptly. Do not engage in alcohol and or illegal drug use while on prescription medicines. In the event of worsening symptoms, call 911 and/or go to the nearest ED for appropriate evaluation and treatment of symptoms. ? ?It was a pleasure meeting you, Philip Roth.  I wish you and your family the best, and hope you stay happy and healthy! ? ?Rosezetta Schlatter, MD ?04/17/2021   ?

## 2021-04-17 NOTE — Progress Notes (Signed)
?  Echocardiogram ?2D Echocardiogram has been performed. ? ?Oneal Deputy Bonham Zingale RDCS ?04/17/2021, 8:49 AM ?

## 2021-04-27 ENCOUNTER — Inpatient Hospital Stay: Payer: Self-pay | Admitting: Family Medicine

## 2021-07-04 ENCOUNTER — Ambulatory Visit (HOSPITAL_COMMUNITY)
Admission: EM | Admit: 2021-07-04 | Discharge: 2021-07-04 | Disposition: A | Discharge status: Home / Self Care | Payer: No Payment, Other

## 2021-07-04 NOTE — ED Triage Notes (Signed)
Pt presents to [facility], voluntarily accompanied by his veterans home Production designer, theatre/television/film. Home manager reports that pt was discharged from the [employer] back in 1990 and pt has been homeless, in between jobs and out of needed medications. Pt has been staying at veterans home for about a year and half and has moments where he comes and goes. Pts house manager reports that pt has a hx of PTSD, but he states that pt has not experienced symptoms of that diagnosis. He reports pt is well mannered and is able to be redirected, but he has concerns about him talking to himself at times. Pt was observed casually dressed and cooperative during triage process. Pt states he was last hospitalized in 2011 for depression when he was in [place]. Pt denies SI, HI, AVH and substance/alcohol use. Pt is routine.

## 2021-07-07 ENCOUNTER — Other Ambulatory Visit (HOSPITAL_COMMUNITY): Payer: Self-pay

## 2021-08-15 ENCOUNTER — Encounter (HOSPITAL_COMMUNITY): Payer: Self-pay

## 2021-08-15 ENCOUNTER — Ambulatory Visit (HOSPITAL_COMMUNITY)
Admission: EM | Admit: 2021-08-15 | Discharge: 2021-08-15 | Disposition: A | Discharge status: Home / Self Care | Payer: Self-pay | Attending: Family Medicine | Admitting: Family Medicine

## 2021-08-15 DIAGNOSIS — I10 Essential (primary) hypertension: Secondary | ICD-10-CM

## 2021-08-15 DIAGNOSIS — E785 Hyperlipidemia, unspecified: Secondary | ICD-10-CM

## 2021-08-15 MED ORDER — DILTIAZEM HCL ER COATED BEADS 120 MG PO CP24
120.0000 mg | ORAL_CAPSULE | Freq: Every day | ORAL | 2 refills | Status: DC
Start: 2021-08-15 — End: 2022-01-16

## 2021-08-15 MED ORDER — ASPIRIN 81 MG PO CHEW: 81.0000 mg | CHEWABLE_TABLET | Freq: Every day | ORAL | 2 refills | Status: DC

## 2021-08-15 MED ORDER — ATORVASTATIN CALCIUM 20 MG PO TABS: 20.0000 mg | ORAL_TABLET | Freq: Every day | ORAL | 2 refills | Status: DC

## 2021-08-15 NOTE — ED Triage Notes (Signed)
Pt states has been out of his b/p meds for a month and now having headaches for the past week.

## 2021-08-16 NOTE — ED Provider Notes (Signed)
[facility]   [id number] 08/15/21 Arrival Time: 1224  ASSESSMENT & PLAN:  1. Elevated blood pressure reading in office with diagnosis of hypertension   2. Hyperlipidemia, unspecified hyperlipidemia type    No signs/symptoms of HTN urgency. Is comfortable starting back on medications. Encouraged him to est care with PCP. Information given.  Meds ordered this encounter  Medications   atorvastatin (LIPITOR) 20 MG tablet    Sig: Take 1 tablet (20 mg total) by mouth daily.    Dispense:  30 tablet    Refill:  2   diltiazem (CARDIZEM CD) 120 MG 24 hr capsule    Sig: Take 1 capsule (120 mg total) by mouth daily.    Dispense:  30 capsule    Refill:  2   aspirin 81 MG chewable tablet    Sig: Chew 1 tablet (81 mg total) by mouth daily.    Dispense:  30 tablet    Refill:  2     Follow-up Information     Schedule an appointment as soon as possible for a visit  with [facility].   Contact information: [address] [place] [address] [phone]                Reviewed expectations re: course of current medical issues. Questions answered. Outlined signs and symptoms indicating need for more acute intervention. Patient verbalized understanding. After Visit Summary given.   SUBJECTIVE:  Philip Roth is a 56 y.o. male who presents with concerns regarding increased blood pressures. Out of BP meds in addition to hypercholesterolemia. Does not have a PCP. Otherwise reports he is well.  He reports taking medications as instructed, no medication side effects noted, no TIA's, no chest pain on exertion, no dyspnea on exertion, and no swelling of ankles.  Denies symptoms of chest pain, palpations, orthopnea, nocturnal dyspnea, or LE edema.  Social History   Tobacco Use  Smoking Status Every Day   Types: Cigarettes  Smokeless Tobacco Never    OBJECTIVE:  Vitals:   08/15/21 1329  BP: (!) 178/99  Pulse: 76  Resp:  18  Temp: 98.4 F (36.9 C)  TempSrc: Oral  SpO2: 98%    General appearance: alert; no distress Eyes: PERRLA; EOMI HENT: normocephalic; atraumatic Neck: supple Lungs: clear to auscultation bilaterally Heart: regular rate and rhythm without murmer Abdomen: soft, non-tender; bowel sounds normal Extremities: no edema; symmetrical with no gross deformities Skin: warm and dry Psychological: alert and cooperative; normal mood and affect  ECG: Orders placed or performed during the hospital encounter of 04/16/21   ED EKG   ED EKG   EKG    Labs Reviewed: Results for orders placed or performed during the hospital encounter of 04/16/21  Basic metabolic panel  Result Value Ref Range   Sodium 138 135 - 145 mmol/L   Potassium 3.4 (L) 3.5 - 5.1 mmol/L   Chloride 104 98 - 111 mmol/L   CO2 23 22 - 32 mmol/L   Glucose, Bld 150 (H) 70 - 99 mg/dL   BUN 8 6 - 20 mg/dL   Creatinine, Ser 4.27 0.61 - 1.24 mg/dL   Calcium 9.2 8.9 - 06.2 mg/dL   GFR, Estimated >37 >62 mL/min   Anion gap 11 5 - 15  CBC  Result Value Ref Range   WBC 5.8 4.0 - 10.5 K/uL   RBC 4.73 4.22 - 5.81 MIL/uL   Hemoglobin 13.9 13.0 - 17.0 g/dL   HCT 83.1 51.7 -  52.0 %   MCV 90.1 80.0 - 100.0 fL   MCH 29.4 26.0 - 34.0 pg   MCHC 32.6 30.0 - 36.0 g/dL   RDW 65.7 84.6 - 96.2 %   Platelets 244 150 - 400 K/uL   nRBC 0.0 0.0 - 0.2 %  Rapid urine drug screen (hospital performed)  Result Value Ref Range   Opiates NONE DETECTED NONE DETECTED   Cocaine POSITIVE (A) NONE DETECTED   Benzodiazepines NONE DETECTED NONE DETECTED   Amphetamines NONE DETECTED NONE DETECTED   Tetrahydrocannabinol NONE DETECTED NONE DETECTED   Barbiturates NONE DETECTED NONE DETECTED  Protime-INR  Result Value Ref Range   Prothrombin Time 12.9 11.4 - 15.2 seconds   INR 1.0 0.8 - 1.2  Heparin level (unfractionated)  Result Value Ref Range   Heparin Unfractionated <0.10 (L) 0.30 - 0.70 IU/mL  HIV Antibody (routine testing w rflx)  Result Value  Ref Range   HIV Screen 4th Generation wRfx Non Reactive Non Reactive  TSH  Result Value Ref Range   TSH 1.116 0.350 - 4.500 uIU/mL  Hemoglobin A1c  Result Value Ref Range   Hgb A1c MFr Bld 4.7 (L) 4.8 - 5.6 %   Mean Plasma Glucose 88.19 mg/dL  Lipid panel  Result Value Ref Range   Cholesterol 239 (H) 0 - 200 mg/dL   Triglycerides 56 <952 mg/dL   HDL 52 >84 mg/dL   Total CHOL/HDL Ratio 4.6 RATIO   VLDL 11 0 - 40 mg/dL   LDL Cholesterol 132 (H) 0 - 99 mg/dL  Basic metabolic panel  Result Value Ref Range   Sodium 138 135 - 145 mmol/L   Potassium 4.1 3.5 - 5.1 mmol/L   Chloride 109 98 - 111 mmol/L   CO2 26 22 - 32 mmol/L   Glucose, Bld 84 70 - 99 mg/dL   BUN 13 6 - 20 mg/dL   Creatinine, Ser 4.40 0.61 - 1.24 mg/dL   Calcium 9.2 8.9 - 10.2 mg/dL   GFR, Estimated >72 >53 mL/min   Anion gap 3 (L) 5 - 15  CBC  Result Value Ref Range   WBC 6.0 4.0 - 10.5 K/uL   RBC 4.67 4.22 - 5.81 MIL/uL   Hemoglobin 13.9 13.0 - 17.0 g/dL   HCT 66.4 40.3 - 47.4 %   MCV 90.4 80.0 - 100.0 fL   MCH 29.8 26.0 - 34.0 pg   MCHC 32.9 30.0 - 36.0 g/dL   RDW 25.9 56.3 - 87.5 %   Platelets 229 150 - 400 K/uL   nRBC 0.0 0.0 - 0.2 %  ECHOCARDIOGRAM COMPLETE  Result Value Ref Range   Weight 2,299.2 oz   Height 68 in   BP 148/79 mmHg   Single Plane A2C EF 46.0 %   Single Plane A4C EF 48.1 %   Calc EF 46.1 %   S' Lateral 3.50 cm   Area-P 1/2 2.76 cm2  Troponin I (High Sensitivity)  Result Value Ref Range   Troponin I (High Sensitivity) 20 (H) <18 ng/L  Troponin I (High Sensitivity)  Result Value Ref Range   Troponin I (High Sensitivity) 22 (H) <18 ng/L     Allergies  Allergen Reactions   Lactose Intolerance (Gi) Diarrhea    Past Medical History:  Diagnosis Date   Hypertension    Polysubstance abuse (HCC)    Tobacco abuse    Social History   Socioeconomic History   Marital status: Single    Spouse name: Not on file  Number of children: Not on file   Years of education: Not on  file   Highest education level: Not on file  Occupational History   Not on file  Tobacco Use   Smoking status: Every Day    Types: Cigarettes   Smokeless tobacco: Never  Substance and Sexual Activity   Alcohol use: Not Currently   Drug use: Yes    Types: Marijuana   Sexual activity: Not on file  Other Topics Concern   Not on file  Social History Narrative   Not on file   Social Determinants of Health   Financial Resource Strain: Not on file  Food Insecurity: Not on file  Transportation Needs: Not on file  Physical Activity: Not on file  Stress: Not on file  Social Connections: Not on file  Intimate Partner Violence: Not on file   Family History  Problem Relation Age of Onset   Stroke Mother    History reviewed. No pertinent surgical history.     Mardella Layman, MD 08/16/21 [phone]

## 2022-01-10 ENCOUNTER — Emergency Department (HOSPITAL_COMMUNITY)
Admission: EM | Admit: 2022-01-10 | Discharge: 2022-01-10 | Disposition: A | Discharge status: Home / Self Care | Payer: No Typology Code available for payment source | Attending: Emergency Medicine | Admitting: Emergency Medicine

## 2022-01-10 ENCOUNTER — Encounter (HOSPITAL_COMMUNITY): Payer: Self-pay | Admitting: Emergency Medicine

## 2022-01-10 ENCOUNTER — Emergency Department (HOSPITAL_COMMUNITY): Payer: No Typology Code available for payment source

## 2022-01-10 ENCOUNTER — Other Ambulatory Visit: Payer: Self-pay

## 2022-01-10 DIAGNOSIS — M542 Cervicalgia: Secondary | ICD-10-CM | POA: Diagnosis not present

## 2022-01-10 DIAGNOSIS — I10 Essential (primary) hypertension: Secondary | ICD-10-CM | POA: Diagnosis not present

## 2022-01-10 DIAGNOSIS — Z87891 Personal history of nicotine dependence: Secondary | ICD-10-CM | POA: Insufficient documentation

## 2022-01-10 DIAGNOSIS — Z7982 Long term (current) use of aspirin: Secondary | ICD-10-CM | POA: Diagnosis not present

## 2022-01-10 DIAGNOSIS — S0240FA Zygomatic fracture, left side, initial encounter for closed fracture: Secondary | ICD-10-CM | POA: Diagnosis not present

## 2022-01-10 LAB — CBG MONITORING, ED: Glucose-Capillary: 110 mg/dL — ABNORMAL HIGH (ref 70–99)

## 2022-01-10 MED ORDER — IBUPROFEN 600 MG PO TABS: 600.0000 mg | ORAL_TABLET | Freq: Four times a day (QID) | ORAL | 0 refills | Status: DC | PRN

## 2022-01-10 MED ORDER — CYCLOBENZAPRINE HCL 5 MG PO TABS: 5.0000 mg | ORAL_TABLET | Freq: Two times a day (BID) | ORAL | 0 refills | Status: DC | PRN

## 2022-01-10 MED ORDER — HYDROCODONE-ACETAMINOPHEN 5-325 MG PO TABS
1.0000 | ORAL_TABLET | Freq: Once | ORAL | Status: AC
  Administered 2022-01-10: 1 via ORAL
  Filled 2022-01-10: qty 1

## 2022-01-10 NOTE — ED Notes (Signed)
Patient ambulated without assistance, patient tolerated well.
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
## 2022-01-16 ENCOUNTER — Encounter (HOSPITAL_COMMUNITY): Payer: Self-pay | Admitting: *Deleted

## 2022-01-16 ENCOUNTER — Ambulatory Visit (HOSPITAL_COMMUNITY)
Admission: EM | Admit: 2022-01-16 | Discharge: 2022-01-16 | Disposition: A | Discharge status: Home / Self Care | Payer: Self-pay | Attending: Emergency Medicine | Admitting: Emergency Medicine

## 2022-01-16 DIAGNOSIS — S39012A Strain of muscle, fascia and tendon of lower back, initial encounter: Secondary | ICD-10-CM

## 2022-01-16 DIAGNOSIS — I10 Essential (primary) hypertension: Secondary | ICD-10-CM

## 2022-01-16 DIAGNOSIS — S66912A Strain of unspecified muscle, fascia and tendon at wrist and hand level, left hand, initial encounter: Secondary | ICD-10-CM

## 2022-01-16 MED ORDER — ASPIRIN 81 MG PO CHEW: 81.0000 mg | CHEWABLE_TABLET | Freq: Every day | ORAL | 2 refills | Status: AC

## 2022-01-16 MED ORDER — DILTIAZEM HCL ER COATED BEADS 120 MG PO CP24: 120.0000 mg | ORAL_CAPSULE | Freq: Every day | ORAL | 2 refills | Status: DC

## 2022-01-16 MED ORDER — CYCLOBENZAPRINE HCL 5 MG PO TABS: 5.0000 mg | ORAL_TABLET | Freq: Two times a day (BID) | ORAL | 0 refills | Status: DC | PRN

## 2022-01-16 MED ORDER — IBUPROFEN 600 MG PO TABS: 600.0000 mg | ORAL_TABLET | Freq: Four times a day (QID) | ORAL | 0 refills | Status: DC | PRN

## 2022-01-16 MED ORDER — ATORVASTATIN CALCIUM 20 MG PO TABS: 20.0000 mg | ORAL_TABLET | Freq: Every day | ORAL | 2 refills | Status: DC

## 2022-01-16 NOTE — Discharge Instructions (Addendum)
A refill of your blood pressure and cholesterol medicines have been sent to the pharmacy.   Flexeril medication has been sent to the pharmacy, this is a muscle relaxant that you can take 2 times daily.  Please do not operate any heavy machinery or drive a car after taking this medicine.  Ibuprofen has been sent to the pharmacy you can use this every 6 hours as needed.   I have attached information for a primary care doctor to your discharge paperwork, please call and set an appointment to have a recheck of your blood pressure and to establish care.   [facility] is attached your paperwork, indicates that the symptoms with your thumb or back do not improve please follow-up with this office.
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
## 2022-02-17 ENCOUNTER — Encounter (HOSPITAL_COMMUNITY): Payer: Self-pay | Admitting: *Deleted

## 2022-02-17 ENCOUNTER — Other Ambulatory Visit: Payer: Self-pay

## 2022-02-17 ENCOUNTER — Ambulatory Visit (HOSPITAL_COMMUNITY)
Admission: EM | Admit: 2022-02-17 | Discharge: 2022-02-17 | Disposition: A | Discharge status: Home / Self Care | Payer: Self-pay | Attending: Physician Assistant | Admitting: Physician Assistant

## 2022-02-17 DIAGNOSIS — I10 Essential (primary) hypertension: Secondary | ICD-10-CM

## 2022-02-17 DIAGNOSIS — M542 Cervicalgia: Secondary | ICD-10-CM

## 2022-02-17 DIAGNOSIS — M79642 Pain in left hand: Secondary | ICD-10-CM

## 2022-02-17 MED ORDER — DILTIAZEM HCL ER COATED BEADS 120 MG PO CP24: 120.0000 mg | ORAL_CAPSULE | Freq: Every day | ORAL | 2 refills | Status: DC

## 2022-02-17 MED ORDER — CYCLOBENZAPRINE HCL 5 MG PO TABS: 5.0000 mg | ORAL_TABLET | Freq: Two times a day (BID) | ORAL | 0 refills | Status: DC | PRN

## 2022-02-17 MED ORDER — ATORVASTATIN CALCIUM 20 MG PO TABS: 20.0000 mg | ORAL_TABLET | Freq: Every day | ORAL | 2 refills | Status: DC
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
## 2022-02-17 NOTE — Discharge Instructions (Signed)
Please pick up hypertensive medications today.  Is important to get your blood pressure under better control.  Please establish care with PCP as discussed at last visit.  If you develop chest pain shortness of breath or headache go to the emergency department. Take Flexeril as needed for neck and back pain.  Follow-up with [facility] for hand pain.

## 2022-02-17 NOTE — ED Notes (Signed)
PT reports he has not been taking BP meds. BP 195/127 during triage. Provider notified.

## 2022-03-16 ENCOUNTER — Ambulatory Visit (HOSPITAL_COMMUNITY)
Admission: EM | Admit: 2022-03-16 | Discharge: 2022-03-16 | Disposition: A | Discharge status: Home / Self Care | Payer: Medicaid Other | Attending: Psychiatry | Admitting: Psychiatry

## 2022-03-16 ENCOUNTER — Encounter (HOSPITAL_COMMUNITY): Payer: Self-pay | Admitting: Psychiatry

## 2022-03-16 DIAGNOSIS — Z7689 Persons encountering health services in other specified circumstances: Secondary | ICD-10-CM | POA: Insufficient documentation

## 2022-03-16 DIAGNOSIS — E7800 Pure hypercholesterolemia, unspecified: Secondary | ICD-10-CM | POA: Insufficient documentation

## 2022-03-16 DIAGNOSIS — F4310 Post-traumatic stress disorder, unspecified: Secondary | ICD-10-CM | POA: Insufficient documentation

## 2022-03-16 DIAGNOSIS — I10 Essential (primary) hypertension: Secondary | ICD-10-CM | POA: Insufficient documentation

## 2022-03-16 DIAGNOSIS — F4323 Adjustment disorder with mixed anxiety and depressed mood: Secondary | ICD-10-CM | POA: Insufficient documentation
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
## 2022-03-16 NOTE — Discharge Instructions (Addendum)
Discharge recommendations:   Outpatient Follow up: Please review list of outpatient resources for psychiatry and counseling. Please follow up with your primary care provider for all medical related needs.   You are encouraged to follow up with [facility] for outpatient treatment.  Walk in/ Open Access Hours: Monday - Friday 8AM - 11AM (To see provider and therapist) Friday - [place] (To see therapist only)  [facility] [place] Therapy: We recommend that patient participate in individual therapy to address mental health concerns.  Safety:   The following safety precautions should be taken:   No sharp objects. This includes scissors, razors, scrapers, and putty knives.   Chemicals should be removed and locked up.   Medications should be removed and locked up.   Weapons should be removed and locked up. This includes firearms, knives and instruments that can be used to cause injury.   The patient should abstain from use of illicit substances/drugs and abuse of any medications.  If symptoms worsen or do not continue to improve or if the patient becomes actively suicidal or homicidal then it is recommended that the patient return to the closest [facility] emergency department, the [facility], or call 911 for further evaluation and treatment. National Suicide Prevention Lifeline 1-800-SUICIDE or (209)279-2488.  About 988 988 offers 24/7 access to trained crisis counselors who can help people experiencing mental health-related distress. People can call or text 988 or chat 988lifeline.org for themselves or if they are worried about a loved one who may need crisis support.

## 2022-03-16 NOTE — ED Provider Notes (Signed)
Behavioral Health Urgent Care Medical Screening Exam  Patient Name: Philip [facility] MRN: [id number] Date of Evaluation: 03/16/22 Chief Complaint:   Diagnosis:  Final diagnoses:  Encounter for psychiatric assessment    History of Present illness: Philip Roth is a 57 y.o. male patient with a reported past psychiatric history significant for PTSD who presents to the [facility] behavioral health urgent care voluntary accompanied by appointment [name] with a chief complaint of,referred by [facility] for an evaluation for services.  Patient seen and evaluated face-to-face by this provider with his friend Rex present, chart reviewed and case discussed with Dr. Leverne Humbles. On evaluation, patient is alert and oriented x 3. His thought process is linear and speech is clear and coherent and decreased tone. His mood is dysphoric and affect is congruent. He has fair eye contact. He is calm and cooperative. He does not appear to be in acute distress.  The patient's friend Rex states that he took the patient to the [place] today to get the patient a mental health diagnosis and services. He states that he was told to bring the patient here for an evaluation. He states that the patient is unable to get [place] services because he was dishonorably discharged from the [employer] in 1990. He states that since he has known the patient he's been talking to himself. He states that the patient was hospitalized in the 90s for 4 to 5 years in the state hospital in [place]. He states that the patient has been living with him for the past two years. 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 He states that the patient moved from [place] to [place] to work at the [employer]. 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 He states that the patient has a history of alcohol and cocaine use in the past. He  states that the patient has been sober for 8 months.   Patient denies SI. He denies past suicide attempts. He denies HI. He denies AVH. He denies paranoia. He does appear to have periods of responding to external stimuli and is observed briefly turning his head to the side and mumbling to himself. He reports feeling depressed sometimes and describes his symptoms as sadness and concerns about his future. He reports fair sleep, 10 hours per night. He denies nightmares or flashbacks. He reports a fair appetite. He denies using alcohol or using illicit drugs. He reports a medical history of HTN and high cholesterol. He reports taking blood pressure medication and cholesterol medications. He denies outpatient psychiatry, counseling or primary care. He states that in the 90s he was prescribed Prozac. He states that the last time he received psychiatric treatment was in the 90s. He states that he resides with Rex. He denies safety concerns with returning home today. He denies access to firearms.  Plan: Patient is recommended to follow up here at the [facility] outpatient clinic for psychiatry for medication management and therapy. Education  provided on the days and times for open access. Safety planning completed: patient should not have access to firearm and sharp objects. If patient's symptoms worsen or if the patient expresses suicidal ideations, or worsening psychosis, take the patient to the nearest ED, [facility] or call 911 for an evaluation.    [place] ED from 02/17/2022 in [facility] at [facility] ED from 01/16/2022  in [place] [facility] at [facility] ED from 01/10/2022 in [facility] Emergency Department at [place] No Risk No Risk No Risk       Psychiatric Specialty Exam  Presentation  General Appearance:Appropriate for Environment  Eye Contact:Fair  Speech:Clear and Coherent  Speech Volume:Normal  Handedness:No data recorded  Mood and Affect   Mood:Dysphoric  Affect:Congruent   Thought Process  Thought Processes:Linear  Descriptions of Associations:Intact  Orientation:Full (Time, Place and Person)  Thought Content:Logical    Hallucinations:None  Ideas of Reference:None  Suicidal Thoughts:No  Homicidal Thoughts:No   Sensorium  Memory:Immediate Fair; Recent Fair; Remote Fair  Judgment:Fair  Insight:Fair   Executive Functions  Concentration:Fair  Attention Span:Fair  [place]   Psychomotor Activity  Psychomotor Activity:Normal   Assets  Assets:Communication Skills; Desire for Improvement; Housing; Catering manager; Leisure Time; Physical Health; Social Support; Resilience   Sleep  Sleep:Fair  Number of hours: 10   Physical Exam: Physical Exam HENT:     Head: Normocephalic.     Nose: Nose normal.  Eyes:     Conjunctiva/sclera: Conjunctivae normal.  Cardiovascular:     Rate and Rhythm: Normal rate.  Pulmonary:     Effort: Pulmonary effort is normal.  Musculoskeletal:        General: Normal range of motion.  Neurological:     Mental Status: He is alert and oriented to person, place, and time.    Review of Systems  Constitutional: Negative.   HENT: Negative.    Eyes: Negative.   Respiratory: Negative.    Cardiovascular: Negative.   Gastrointestinal: Negative.   Genitourinary: Negative.   Musculoskeletal: Negative.   Neurological: Negative.   Endo/Heme/Allergies: Negative.    Blood pressure (!) 188/89, pulse 65, temperature 98 F (36.7 C), temperature source Oral, resp. rate 16, height '5\' 8"'$  (1.727 m), weight 170 lb (77.1 kg), SpO2 100 %. Body mass index is 25.85 kg/m.  Musculoskeletal: Strength & Muscle Tone: within normal limits Gait & Station: normal Patient leans: N/A   [place] MSE Discharge Disposition for Follow up and Recommendations: Based on my evaluation the patient does not appear to have an emergency  medical condition and can be discharged with resources and follow up care in outpatient services for Medication Management, Individual Therapy, and Group Therapy  Discharge recommendations:   Outpatient Follow up: Please review list of outpatient resources for psychiatry and counseling. Please follow up with your primary care provider for all medical related needs.   You are encouraged to follow up with [facility] for outpatient treatment.  Walk in/ Open Access Hours: Monday - Friday 8AM - 11AM (To see provider and therapist) Friday - [place] (To see therapist only)  [place] Therapy: We recommend that patient participate in individual therapy to address mental health concerns.  Safety:   The following safety precautions should be taken:   No sharp objects. This includes scissors, razors, scrapers, and putty knives.   Chemicals should be removed and locked up.   Medications should be removed and locked up.   Weapons should be removed and locked up. This includes firearms, knives and instruments that can be used to cause injury.   The patient should abstain from use of illicit substances/drugs and abuse of any medications.  If symptoms worsen or do not continue to improve or if the patient becomes actively suicidal or homicidal then  it is recommended that the patient return to the closest hospital emergency department, the [facility], or call 911 for further evaluation and treatment. National Suicide Prevention Lifeline 1-800-SUICIDE or 343-212-5428.  About 988 988 offers 24/7 access to trained crisis counselors who can help people experiencing mental health-related distress. People can call or text 988 or chat 988lifeline.org for themselves or if they are worried about a loved one who may need crisis support.     Deshanti Adcox L, NP 03/16/2022, 1:18  PM

## 2022-03-21 ENCOUNTER — Other Ambulatory Visit: Payer: Self-pay

## 2022-03-21 ENCOUNTER — Encounter (HOSPITAL_COMMUNITY): Payer: Self-pay | Admitting: Psychiatry

## 2022-03-21 ENCOUNTER — Ambulatory Visit (INDEPENDENT_AMBULATORY_CARE_PROVIDER_SITE_OTHER): Payer: Medicaid Other | Admitting: Psychiatry

## 2022-03-21 VITALS — BP 173/98 | HR 60 | Ht 68.0 in | Wt 162.6 lb

## 2022-03-21 DIAGNOSIS — F250 Schizoaffective disorder, bipolar type: Secondary | ICD-10-CM

## 2022-03-21 MED ORDER — RISPERIDONE 1 MG PO TABS
1.0000 mg | ORAL_TABLET | Freq: Every day | ORAL | 3 refills | Status: DC
  Filled 2022-03-21: qty 30, 30d supply, fill #0
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
## 2022-04-17 ENCOUNTER — Encounter (HOSPITAL_COMMUNITY): Payer: Self-pay | Admitting: Emergency Medicine

## 2022-04-17 ENCOUNTER — Ambulatory Visit (HOSPITAL_COMMUNITY)
Admission: EM | Admit: 2022-04-17 | Discharge: 2022-04-17 | Disposition: A | Discharge status: Home / Self Care | Payer: Medicaid Other

## 2022-04-17 DIAGNOSIS — M79644 Pain in right finger(s): Secondary | ICD-10-CM

## 2022-04-17 NOTE — ED Provider Notes (Signed)
[place]    CSN: [id number] Arrival date & time: 04/17/22  1650      History   Chief Complaint Chief Complaint  Patient presents with   Wrist Pain    HPI Loghan Markel is a 57 y.o. male.   Patient presents to urgent care for evaluation of left thumb/left wrist pain that started approximately 1 month ago and has worsened over the last 3 weeks.  He states when he lifts up heavy objects while he is at work, this makes his thumb pain worse.  Pain is described to the left first digit MCP.  States he can sometimes feel his thumb "pop" but denies significant pain with this. Denies numbness and tingling to the left hand, forearm discomfort, wrist discomfort, and recent trauma/injuries to the left thumb/right hand.  No fever or chills. Denies history of gout.  He states symptoms started out worse in the morning when he wakes up but pain improved significantly throughout the day once he gets to moving.  He has never been diagnosed with arthritis of the left thumb/right hand in the past.  No swelling or redness to the area of greatest tenderness.  No recent steroid or antibiotic use.  Tylenol has helped significantly with pain.    Wrist Pain    Past Medical History:  Diagnosis Date   Hypertension    Polysubstance abuse ([place])    Tobacco abuse     Patient Active Problem List   Diagnosis Date Noted   Adjustment disorder with mixed anxiety and depressed mood 03/16/2022   Stable angina    Hypertensive urgency 04/16/2021    History reviewed. No pertinent surgical history.     Home Medications    Prior to Admission medications   Medication Sig Start Date End Date Taking? Authorizing Provider  aspirin 81 MG chewable tablet Chew 1 tablet (81 mg total) by mouth daily. 01/16/22   Flossie Dibble, NP  atorvastatin (LIPITOR) 20 MG tablet Take 1 tablet (20 mg total) by mouth daily. 02/17/22   Ward, Lenise Arena, PA-C  cyclobenzaprine (FLEXERIL) 5 MG tablet Take 1 tablet (5 mg  total) by mouth 2 (two) times daily as needed for muscle spasms. 02/17/22   Ward, Lenise Arena, PA-C  diltiazem (CARDIZEM CD) 120 MG 24 hr capsule Take 1 capsule (120 mg total) by mouth daily. 02/17/22   Ward, Lenise Arena, PA-C  ibuprofen (ADVIL) 600 MG tablet Take 1 tablet (600 mg total) by mouth every 6 (six) hours as needed. 01/16/22   Flossie Dibble, NP  risperiDONE (RISPERDAL) 1 MG tablet Take 1 tablet (1 mg total) by mouth at bedtime. 03/21/22   Salley Slaughter, NP    Family History Family History  Problem Relation Age of Onset   Stroke Mother     Social History Social History   Tobacco Use   Smoking status: Every Day    Types: Cigarettes   Smokeless tobacco: Never  Substance Use Topics   Alcohol use: Not Currently   Drug use: Yes    Types: Marijuana     Allergies   Lactose intolerance (gi)   Review of Systems Review of Systems Per HPI  Physical Exam Triage Vital Signs ED Triage Vitals [04/17/22 1743]  Enc Vitals Group     BP (!) 188/100     Pulse Rate 85     Resp 16     Temp 98.6 F (37 C)     Temp Source Oral  SpO2 98 %     Weight      Height      Head Circumference      Peak Flow      Pain Score 8     Pain Loc      Pain Edu?      Excl. in [place]?    No data found.  Updated Vital Signs BP (!) 188/100 (BP Location: Right Arm)   Pulse 85   Temp 98.6 F (37 C) (Oral)   Resp 16   SpO2 98%   Visual Acuity Right Eye Distance:   Left Eye Distance:   Bilateral Distance:    Right Eye Near:   Left Eye Near:    Bilateral Near:     Physical Exam Vitals and nursing note reviewed.  Constitutional:      Appearance: He is not ill-appearing or toxic-appearing.  HENT:     Head: Normocephalic and atraumatic.     Right Ear: Hearing and external ear normal.     Left Ear: Hearing and external ear normal.     Nose: Nose normal.     Mouth/Throat:     Lips: Pink.     Mouth: Mucous membranes are moist.     Pharynx: No posterior oropharyngeal  erythema.  Eyes:     General: Lids are normal. Vision grossly intact. Gaze aligned appropriately.     Extraocular Movements: Extraocular movements intact.     Conjunctiva/sclera: Conjunctivae normal.  Cardiovascular:     Rate and Rhythm: Normal rate and regular rhythm.     Heart sounds: Normal heart sounds, S1 normal and S2 normal.  Pulmonary:     Effort: Pulmonary effort is normal. No respiratory distress.     Breath sounds: Normal breath sounds and air entry.  Musculoskeletal:     Right hand: Normal.     Left hand: Tenderness present. No swelling, deformity, lacerations or bony tenderness. Normal range of motion. Normal strength. Normal sensation. There is no disruption of two-point discrimination. Normal capillary refill. Normal pulse.     Cervical back: Neck supple.     Comments: Negative Phalen's test bilaterally.  Slight tenderness to palpation at the left first digit MCP.  Normal range of motion to the joints of the left hand and wrist.  Strength and sensation intact distally.  +2 left radial pulse.  Skin:    General: Skin is warm and dry.     Capillary Refill: Capillary refill takes less than 2 seconds.     Findings: No rash.  Neurological:     General: No focal deficit present.     Mental Status: He is alert and oriented to person, place, and time. Mental status is at baseline.     Cranial Nerves: No dysarthria or facial asymmetry.  Psychiatric:        Mood and Affect: Mood normal.        Speech: Speech normal.        Behavior: Behavior normal.        Thought Content: Thought content normal.        Judgment: Judgment normal.      UC Treatments / Results  Labs (all labs ordered are listed, but only abnormal results are displayed) Labs Reviewed - No data to display  EKG   Radiology No results found.  Procedures Procedures (including critical care time)  Medications Ordered in UC Medications - No data to display  Initial Impression / Assessment and Plan / UC  Course  I have reviewed the triage vital signs and the nursing notes.  Pertinent labs & imaging results that were available during my care of the patient were reviewed by me and considered in my medical decision making (see chart for details).   1.  Pain of right thumb Presentation is consistent with chronic and atraumatic arthritic pain to the left thumb.  Stable musculoskeletal exam findings with atraumatic presentation, therefore deferred imaging.  Low suspicion for acute musculoskeletal abnormality.  Neurovascularly intact distally to injury/pain.  RICE advised.  Walking referral to orthopedics given should symptoms fail to improve with purchased brace from [employer] and Tylenol in the next few weeks.   Discussed physical exam and available lab work findings in clinic with patient.  Counseled patient regarding appropriate use of medications and potential side effects for all medications recommended or prescribed today. Discussed red flag signs and symptoms of worsening condition,when to call the PCP office, return to urgent care, and when to seek higher level of care in the emergency department. Patient verbalizes understanding and agreement with plan. All questions answered. Patient discharged in stable condition.    Final Clinical Impressions(s) / UC Diagnoses   Final diagnoses:  Pain of right thumb     Discharge Instructions      Your right thumb pain is likely due to arthritis. Continue taking Tylenol 1000 mg every 6 hours as needed for pain and inflammation. Purchase another brace from [employer] to stabilize the joint and improve pain. Call the orthopedic doctor listed on your paperwork to schedule a follow-up appointment should your symptoms fail to improve further. Rest, ice, elevate, and compress the area of pain.  Return to urgent care as needed.   ED Prescriptions   None    PDMP not reviewed this encounter.   Talbot Grumbling, [place] 04/17/22 1827

## 2022-04-17 NOTE — ED Triage Notes (Signed)
Pt c/o left wrist pain that has been ongoing over past month or so. Become worse over past 3 weeks. Took tylenol for pain

## 2022-04-17 NOTE — Discharge Instructions (Addendum)
Your right thumb pain is likely due to arthritis. Continue taking Tylenol 1000 mg every 6 hours as needed for pain and inflammation. Purchase another brace from [employer] to stabilize the joint and improve pain. Call the orthopedic doctor listed on your paperwork to schedule a follow-up appointment should your symptoms fail to improve further. Rest, ice, elevate, and compress the area of pain.  Return to urgent care as needed.

## 2022-05-01 ENCOUNTER — Encounter (HOSPITAL_COMMUNITY): Payer: No Payment, Other | Admitting: Psychiatry

## 2022-05-03 ENCOUNTER — Other Ambulatory Visit (HOSPITAL_COMMUNITY): Payer: Self-pay | Admitting: Psychiatry

## 2022-05-03 ENCOUNTER — Encounter (HOSPITAL_COMMUNITY): Payer: No Payment, Other | Admitting: Psychiatry

## 2022-05-03 DIAGNOSIS — F250 Schizoaffective disorder, bipolar type: Secondary | ICD-10-CM

## 2022-05-03 DIAGNOSIS — F411 Generalized anxiety disorder: Secondary | ICD-10-CM

## 2022-05-03 MED ORDER — RISPERIDONE 3 MG PO TABS: 3.0000 mg | ORAL_TABLET | Freq: Every day | ORAL | 3 refills | Status: DC

## 2022-05-03 MED ORDER — HYDROXYZINE HCL 10 MG PO TABS: 10.0000 mg | ORAL_TABLET | Freq: Three times a day (TID) | ORAL | 3 refills | Status: DC | PRN

## 2022-05-03 NOTE — Telephone Encounter (Signed)
Patient informed Clinical research associate that he forgot about his appointment today.  Patient was rescheduled for April 22 at 1:30.  He notes that he continues to have symptoms of hypomania such as fluctuation in mood, irritability, distractibility, racing thoughts, VAH, and fluctuations in mood.  He notes that he has been more anxious and requested provider to fill Xanax.  Provider informed patient that benzodiazepine would not be filled at this time and recommended hydroxyzine to be taken as needed.  Patient was agreeable to starting hydroxyzine 10 mg 3 times daily as needed to help manage anxiety.  Risperdal also increased from 1 mg to 3 mg to help manage symptoms of psychosis, sleep, and mood.  Patient reports that he continues to be sober from illegal substances but notes he craves it.  He informed Clinical research associate that he did not have the financial means to get the substances but is hopeful that he will continue to manage sobriety.  No other concerns noted at this time.

## 2022-05-14 ENCOUNTER — Encounter (HOSPITAL_COMMUNITY): Payer: No Payment, Other | Admitting: Psychiatry

## 2022-05-14 ENCOUNTER — Other Ambulatory Visit (HOSPITAL_COMMUNITY): Payer: Self-pay | Admitting: Psychiatry

## 2022-05-14 ENCOUNTER — Other Ambulatory Visit: Payer: Self-pay

## 2022-05-14 DIAGNOSIS — F250 Schizoaffective disorder, bipolar type: Secondary | ICD-10-CM

## 2022-05-14 DIAGNOSIS — F411 Generalized anxiety disorder: Secondary | ICD-10-CM

## 2022-05-14 MED ORDER — HYDROXYZINE HCL 10 MG PO TABS
10.0000 mg | ORAL_TABLET | Freq: Three times a day (TID) | ORAL | 3 refills | Status: DC | PRN
  Filled 2022-05-14: qty 90, 30d supply, fill #0

## 2022-05-14 MED ORDER — RISPERIDONE 1 MG PO TABS
1.0000 mg | ORAL_TABLET | Freq: Every day | ORAL | 3 refills | Status: DC
  Filled 2022-05-14: qty 30, 30d supply, fill #0

## 2022-05-14 NOTE — Telephone Encounter (Signed)
Patient informed Clinical research associate that he is having car issues and cannot come into the clinic for his appointment.  He informed Clinical research associate that since starting risperidone his mood is more stable.  He denies symptoms of psychosis.  At times he notes that he feels paranoid but notes that he is able to cope with it.  Patient also notes that he has been sleeping adequately on Risperdal.  He notes that his anxiety and his depression are well-managed.  Patient informed Clinical research associate that he was unable to pick up hydroxyzine from the pharmacy as it was too expensive but notes that he is still interested in trialing it.  No medication changes made today.  Patient agreeable to continue medication as prescribed.  No other concerns at this time.

## 2022-05-21 ENCOUNTER — Other Ambulatory Visit: Payer: Self-pay

## 2022-05-29 ENCOUNTER — Encounter (HOSPITAL_COMMUNITY): Payer: No Payment, Other | Admitting: Psychiatry

## 2022-06-21 ENCOUNTER — Other Ambulatory Visit: Payer: Self-pay

## 2022-06-21 ENCOUNTER — Encounter (HOSPITAL_COMMUNITY): Payer: Medicaid Other | Admitting: Psychiatry

## 2022-06-21 ENCOUNTER — Other Ambulatory Visit (HOSPITAL_COMMUNITY): Payer: Self-pay | Admitting: Psychiatry

## 2022-06-21 DIAGNOSIS — F411 Generalized anxiety disorder: Secondary | ICD-10-CM

## 2022-06-21 DIAGNOSIS — F250 Schizoaffective disorder, bipolar type: Secondary | ICD-10-CM

## 2022-06-21 MED ORDER — RISPERIDONE 1 MG PO TABS
1.0000 mg | ORAL_TABLET | Freq: Every day | ORAL | 3 refills | Status: DC
  Filled 2022-06-21: qty 30, 30d supply, fill #0

## 2022-06-21 MED ORDER — HYDROXYZINE HCL 10 MG PO TABS
10.0000 mg | ORAL_TABLET | Freq: Three times a day (TID) | ORAL | 3 refills | Status: DC | PRN
  Filled 2022-06-21: qty 90, 30d supply, fill #0

## 2022-06-21 NOTE — Telephone Encounter (Signed)
Writer called patient and was informed by his group home caregiver that he was not present.  Patient informed Clinical research associate that they forgot about his appointment.  Provider encouraged group home caregiver to have patient reschedule appointment.  Medication was sent to preferred pharmacy to bridge patient until his next appointment.

## 2022-06-28 ENCOUNTER — Other Ambulatory Visit: Payer: Self-pay

## 2022-07-10 ENCOUNTER — Encounter (HOSPITAL_COMMUNITY): Payer: Self-pay | Admitting: Psychiatry

## 2022-07-10 ENCOUNTER — Ambulatory Visit (INDEPENDENT_AMBULATORY_CARE_PROVIDER_SITE_OTHER): Payer: Medicaid Other | Admitting: Psychiatry

## 2022-07-10 VITALS — BP 188/118 | HR 59 | Temp 98.3°F | Wt 157.6 lb

## 2022-07-10 DIAGNOSIS — F411 Generalized anxiety disorder: Secondary | ICD-10-CM

## 2022-07-10 DIAGNOSIS — F250 Schizoaffective disorder, bipolar type: Secondary | ICD-10-CM | POA: Diagnosis not present

## 2022-07-10 DIAGNOSIS — F4310 Post-traumatic stress disorder, unspecified: Secondary | ICD-10-CM

## 2022-07-10 DIAGNOSIS — I10 Essential (primary) hypertension: Secondary | ICD-10-CM

## 2022-07-10 DIAGNOSIS — Z0000 Encounter for general adult medical examination without abnormal findings: Secondary | ICD-10-CM

## 2022-07-10 MED ORDER — RISPERIDONE ER 50 MG/0.14ML ~~LOC~~ SUSY: 50.0000 mg | PREFILLED_SYRINGE | SUBCUTANEOUS | Status: DC

## 2022-07-10 MED ORDER — HYDROXYZINE HCL 10 MG PO TABS: 10.0000 mg | ORAL_TABLET | Freq: Three times a day (TID) | ORAL | 3 refills | Status: DC | PRN

## 2022-07-10 MED ORDER — UZEDY 50 MG/0.14ML ~~LOC~~ SUSY: 50.0000 mg | PREFILLED_SYRINGE | SUBCUTANEOUS | 11 refills | Status: DC

## 2022-07-10 MED ORDER — CLONIDINE HCL 0.1 MG PO TABS: 0.2000 mg | ORAL_TABLET | Freq: Two times a day (BID) | ORAL | 3 refills | Status: DC

## 2022-07-10 NOTE — Progress Notes (Signed)
BH MD/PA/NP OP Progress Note  07/10/2022 10:30 AM Philip Roth  MRN:  [id number]  Chief Complaint: " I like Risperdal but sometimes I forge to take it"   HPI: 57 year old male seen today for follow-up psychiatric evaluation.  He has a psychiatric history of PTSD, Bipolar depression, alcohol use, cocaine use (sober 10 months), hospitalized in the 90s for 4 to 5 years [facility] the [facility] [place].  Currently he is managed on Risperdal 1 mg nightly.  He notes his medication is somewhat effective in managing his psychiatric conditions.  Today he was well-groomed, pleasant, cooperative, and engaged in conversation.  He informed Clinical research associate that since starting Risperdal he is sleeping better and notes that his symptoms of psychosis are improved.  He however reports that at times he forgets to take Risperdal.  Today patient still presents with delusional thinking and VAH.  He informed Clinical research associate that at times he believes that he can fly.  He also notes that at times he feels like a God.  Patient notes that he continues to hear voices noting that they chatter around him.  He also notes that he sees figures.    Patient also reports that he continues to be somewhat anxious and depressed.  He notes that he worries about his health, receiving disability, receiving housing, and finances.  Today provider conducted a GAD-7 and patient scored an 11, at his last visit he scored an 8.  Provider also conducted PHQ-9 and patient scored a 14, at his last visit he scored a 15.  He notes that he sleeps approximately 8 hours nightly.  He also informed Clinical research associate that he has an adequate appetite.  Patient informed Clinical research associate that he continues to suffer from symptoms of PTSD.  Patient was dishonorably discharged from the [facility] in 1989 for poor conduct.  Per patient he was asked to stay in the area for an amount of time however he reports that because of his mental health he was not able to stay still.  He would leave this position  frequently.  Patient endorses flashbacks and reports that he avoids things that remind him of his time in the service.  He informed Clinical research associate that he saw people being harmed in fear that this will take place again.  Patient was seen with his caregiver Mr. Rex Hamm who notes that he is trying to assist the patient in getting services at the [place].  He and the patient requested that provider write a letter detailing his mental health.  Provider was agreeable to this.  Patient's blood pressure is notably elevated today at 188/118.  He has a history of hypertension however notes that he does not have a primary care doctor and has not had medications for his hypertension in a while.  Patient referred to community health and wellness for primary care and provider informed patient that she would start clonidine to help manage symptoms of PTSD as well as hypertension.  Patient reports he drinks alcohol occasionally and smokes 2 cigarettes daily.  Today he is agreeable to starting Uzedy 50 mg every 28 days.  He will return on 07/17/2022 for his first injection.  Patient agreeable to starting hydroxyzine 10 mg 3 times daily as needed to help manage his anxiety.  Patient was referred to community health and wellness for primary care and started on clonidine 0.1 mg twice daily to help with the blood pressure, anxiety, and symptoms of PTSD.  Potential side effects of medication and risks vs benefits of  treatment vs non-treatment were explained and discussed. All questions were answered. No other concerns noted at this time,   Visit Diagnosis:    ICD-10-CM   1. Primary hypertension  I10 cloNIDine (CATAPRES) 0.1 MG tablet    Ambulatory referral to Internal Medicine    2. Well adult exam  Z00.00 cloNIDine (CATAPRES) 0.1 MG tablet    3. PTSD (post-traumatic stress disorder)  F43.10 cloNIDine (CATAPRES) 0.1 MG tablet    risperiDONE ER SUSY 50 mg    risperiDONE ER (UZEDY) 50 MG/0.14ML SUSY    4. Schizoaffective disorder,  bipolar type (HCC)  F25.0 risperiDONE ER SUSY 50 mg    5. Anxiety state  F41.1 hydrOXYzine (ATARAX) 10 MG tablet      Past Psychiatric History: Denies   Past Medical History:  Past Medical History:  Diagnosis Date   Hypertension    Polysubstance abuse (HCC)    Tobacco abuse    No past surgical history on file.  Family Psychiatric History:  PTSD, Bipolar depression, alcohol use, cocaine use (in remission), hospitalized in the 90s for 4 to 5 years in the state hospital in [place]   Family History:  Family History  Problem Relation Age of Onset   Stroke Mother     Social History:  Social History   Socioeconomic History   Marital status: Single    Spouse name: Not on file   Number of children: Not on file   Years of education: Not on file   Highest education level: Not on file  Occupational History   Not on file  Tobacco Use   Smoking status: Every Day    Types: Cigarettes   Smokeless tobacco: Never  Substance and Sexual Activity   Alcohol use: Not Currently   Drug use: Yes    Types: Marijuana   Sexual activity: Not on file  Other Topics Concern   Not on file  Social History Narrative   Not on file   Social Determinants of Health   Financial Resource Strain: Not on file  Food Insecurity: Not on file  Transportation Needs: Not on file  Physical Activity: Not on file  Stress: Not on file  Social Connections: Not on file    Allergies:  Allergies  Allergen Reactions   Lactose Intolerance (Gi) Diarrhea    Metabolic Disorder Labs: Lab Results  Component Value Date   HGBA1C 4.7 (L) 04/16/2021   MPG 88.19 04/16/2021   No results found for: "PROLACTIN" Lab Results  Component Value Date   CHOL 239 (H) 04/16/2021   TRIG 56 04/16/2021   HDL 52 04/16/2021   CHOLHDL 4.6 04/16/2021   VLDL 11 04/16/2021   LDLCALC 176 (H) 04/16/2021   Lab Results  Component Value Date   TSH 1.116 04/16/2021    Therapeutic Level Labs: No results found for:  "LITHIUM" No results found for: "VALPROATE" No results found for: "CBMZ"  Current Medications: Current Outpatient Medications  Medication Sig Dispense Refill   cloNIDine (CATAPRES) 0.1 MG tablet Take 2 tablets (0.2 mg total) by mouth 2 (two) times daily. 60 tablet 3   risperiDONE ER (UZEDY) 50 MG/0.14ML SUSY Inject 50 mg into the skin every 28 (twenty-eight) days. 1.4 mL 11   aspirin 81 MG chewable tablet Chew 1 tablet (81 mg total) by mouth daily. 30 tablet 2   atorvastatin (LIPITOR) 20 MG tablet Take 1 tablet (20 mg total) by mouth daily. 30 tablet 2   cyclobenzaprine (FLEXERIL) 5 MG tablet Take 1 tablet (5  mg total) by mouth 2 (two) times daily as needed for muscle spasms. 10 tablet 0   diltiazem (CARDIZEM CD) 120 MG 24 hr capsule Take 1 capsule (120 mg total) by mouth daily. 30 capsule 2   hydrOXYzine (ATARAX) 10 MG tablet Take 1 tablet (10 mg total) by mouth 3 (three) times daily as needed. 90 tablet 3   ibuprofen (ADVIL) 600 MG tablet Take 1 tablet (600 mg total) by mouth every 6 (six) hours as needed. 30 tablet 0   Current Facility-Administered Medications  Medication Dose Route Frequency Provider Last Rate Last Admin   [START ON 07/17/2022] risperiDONE ER SUSY 50 mg  50 mg Subcutaneous Q28 days Shanna Cisco, NP         Musculoskeletal: Strength & Muscle Tone: within normal limits Gait & Station: normal Patient leans: N/A  Psychiatric Specialty Exam: Review of Systems  Blood pressure (!) 188/118, pulse (!) 59, temperature 98.3 F (36.8 C), weight 157 lb 9.6 oz (71.5 kg), SpO2 99 %.Body mass index is 23.96 kg/m.  General Appearance: Well Groomed  Eye Contact:  Good  Speech:  Clear and Coherent and Normal Rate  Volume:  Normal  Mood:  Anxious and Depressed  Affect:  Appropriate and Congruent  Thought Process:  Coherent, Goal Directed, and Linear  Orientation:  Full (Time, Place, and Person)  Thought Content: Logical, Hallucinations: Auditory Visual, and Paranoid  Ideation   Suicidal Thoughts:  No  Homicidal Thoughts:  No  Memory:  Immediate;   Good Recent;   Good Remote;   Good  Judgement:  Good  Insight:  Good  Psychomotor Activity:  Normal  Concentration:  Concentration: Good and Attention Span: Good  Recall:  Good  Fund of Knowledge: Good  Language: Good  Akathisia:  No  Handed:  Right  AIMS (if indicated): not done  Assets:  Communication Skills Desire for Improvement Financial Resources/Insurance Housing Leisure Time Physical Health Social Support  ADL's:  Intact  Cognition: WNL  Sleep:  Good   Screenings: GAD-7    Flowsheet Row Clinical Support from 07/10/2022 in [facility] Office Visit from 03/21/2022 in [facility]  Total GAD-7 Score 11 8      PHQ2-9    Flowsheet Row Clinical Support from 07/10/2022 in [facility] Office Visit from 03/21/2022 in [facility]  PHQ-2 Total Score 3 3  PHQ-9 Total Score 14 15      Flowsheet Row ED from 04/17/2022 in [facility] Visit from 03/21/2022 in [facility] ED from 02/17/2022 in [facility] RISK CATEGORY No Risk Low Risk No Risk        Assessment and Plan: Patient informed writer that his anxiety and depression has somewhat improved however notes that he continues to be anxious.  He also continues to endorse [facility] and delusional thinking.  He notes that he finds Risperdal effective but notes that he only takes it periodically if he forgets.  Provider asked patient if he would be interested in a long-acting injectable and he reports that he was.  Today he is agreeable to starting Uzedy 50 mg every 28 days.  He will return on 07/17/2022 for his first injection.  Patient agreeable to starting hydroxyzine 10 mg 3 times daily as needed to help manage his anxiety.  Patient blood pressure  elevated today at 188/118.  Patient was referred  to community health and wellness for primary care and started on clonidine 0.1 mg twice daily to help with the blood pressure, anxiety, and symptoms of PTSD.  Patient reports that he continues to have flashbacks and avoid being things that might his time in the [employer].  He also reports that he fears that something bad will happen because of this past trauma in the [facility].  Patient requested provider write a letter regarding his mental health so that he will be able to receive services from the [place].  Provider was agreeable to this.  1. Primary hypertension  Start- cloNIDine (CATAPRES) 0.1 MG tablet; Take 2 tablets (0.2 mg total) by mouth 2 (two) times daily.  Dispense: 60 tablet; Refill: 3 - Ambulatory referral to Internal Medicine  2. Well adult exam  Start- cloNIDine (CATAPRES) 0.1 MG tablet; Take 2 tablets (0.2 mg total) by mouth 2 (two) times daily.  Dispense: 60 tablet; Refill: 3  3. PTSD (post-traumatic stress disorder)  Start- cloNIDine (CATAPRES) 0.1 MG tablet; Take 2 tablets (0.2 mg total) by mouth 2 (two) times daily.  Dispense: 60 tablet; Refill: 3 Start- risperiDONE ER SUSY 50 mg Start- risperiDONE ER (UZEDY) 50 MG/0.14ML SUSY; Inject 50 mg into the skin every 28 (twenty-eight) days.  Dispense: 1.4 mL; Refill: 11  4. Schizoaffective disorder, bipolar type (HCC)  Start- risperiDONE ER SUSY 50 mg  5. Anxiety state  Start- hydrOXYzine (ATARAX) 10 MG tablet; Take 1 tablet (10 mg total) by mouth 3 (three) times daily as needed.  Dispense: 90 tablet; Refill: 3    Collaboration of Care: Collaboration of Care: Other provider involved in patient's care AEB PCP and shot clinic staff  Patient/Guardian was advised Release of Information must be obtained prior to any record release in order to collaborate their care with an outside provider. Patient/Guardian was advised if they have not already done so to contact the registration department to  sign all necessary forms in order for [place] to release information regarding their care.   Consent: Patient/Guardian gives verbal consent for treatment and assignment of benefits for services provided during this visit. Patient/Guardian expressed understanding and agreed to proceed.    Shanna Cisco, NP 07/10/2022, 10:30 AM

## 2022-07-17 ENCOUNTER — Ambulatory Visit (HOSPITAL_COMMUNITY): Payer: No Payment, Other

## 2022-07-19 ENCOUNTER — Ambulatory Visit (HOSPITAL_COMMUNITY): Payer: Medicaid Other

## 2022-08-19 ENCOUNTER — Encounter (HOSPITAL_COMMUNITY): Payer: Self-pay

## 2022-08-19 ENCOUNTER — Ambulatory Visit (HOSPITAL_COMMUNITY)
Admission: EM | Admit: 2022-08-19 | Discharge: 2022-08-19 | Disposition: A | Discharge status: Home / Self Care | Payer: MEDICAID | Attending: Family Medicine | Admitting: Family Medicine

## 2022-08-19 DIAGNOSIS — F4310 Post-traumatic stress disorder, unspecified: Secondary | ICD-10-CM | POA: Diagnosis not present

## 2022-08-19 DIAGNOSIS — Z0000 Encounter for general adult medical examination without abnormal findings: Secondary | ICD-10-CM | POA: Diagnosis not present

## 2022-08-19 DIAGNOSIS — I10 Essential (primary) hypertension: Secondary | ICD-10-CM | POA: Diagnosis not present

## 2022-08-19 MED ORDER — CLONIDINE HCL 0.1 MG PO TABS: 0.2000 mg | ORAL_TABLET | Freq: Two times a day (BID) | ORAL | 2 refills | Status: DC

## 2022-08-19 MED ORDER — DILTIAZEM HCL ER COATED BEADS 120 MG PO CP24: 120.0000 mg | ORAL_CAPSULE | Freq: Every day | ORAL | 2 refills | Status: DC

## 2022-08-19 NOTE — Discharge Instructions (Addendum)
Continue your blood pressure medicines:  Clonidine 0.1 mg--2 tablets by mouth 2 times daily for blood pressure.  Diltiazem 120 mg--1 capsule once daily for blood pressure.  You can use the QR code/website at the back of the summary paperwork to schedule yourself a new patient appointment with primary care

## 2022-08-19 NOTE — ED Triage Notes (Signed)
Patient reports that he has ben out of his medications x 1 month including high BP meds Patient c/o dizziness and states his BP has been been in the 170-190 systolic range.

## 2022-08-19 NOTE — ED Provider Notes (Signed)
[facility]    CSN: [id number] Arrival date & time: 08/19/22  1001      History   Chief Complaint Chief Complaint  Patient presents with   Hypertension   Dizziness   Medication Refill    HPI Philip Roth is a 57 y.o. male.    Hypertension  Dizziness Medication Refill Here for dizziness and headache.  The dizziness has been going on for a couple of days.  No fever or upper respiratory symptoms or vomiting or diarrhea.  He usually takes clonidine and diltiazem for hypertension, but has been out about a month. He does not have a primary care doctor     Past Medical History:  Diagnosis Date   Hypertension    Polysubstance abuse (HCC)    Tobacco abuse     Patient Active Problem List   Diagnosis Date Noted   Adjustment disorder with mixed anxiety and depressed mood 03/16/2022   Stable angina    Hypertensive urgency 04/16/2021    History reviewed. No pertinent surgical history.     Home Medications    Prior to Admission medications   Medication Sig Start Date End Date Taking? Authorizing Provider  aspirin 81 MG chewable tablet Chew 1 tablet (81 mg total) by mouth daily. 01/16/22   Debby Freiberg, NP  atorvastatin (LIPITOR) 20 MG tablet Take 1 tablet (20 mg total) by mouth daily. 02/17/22   Ward, Tylene Fantasia, PA-C  cloNIDine (CATAPRES) 0.1 MG tablet Take 2 tablets (0.2 mg total) by mouth 2 (two) times daily. 08/19/22   Zenia Resides, MD  diltiazem (CARDIZEM CD) 120 MG 24 hr capsule Take 1 capsule (120 mg total) by mouth daily. 08/19/22   Zenia Resides, MD  hydrOXYzine (ATARAX) 10 MG tablet Take 1 tablet (10 mg total) by mouth 3 (three) times daily as needed. 07/10/22   Shanna Cisco, NP  risperiDONE ER (UZEDY) 50 MG/0.14ML SUSY Inject 50 mg into the skin every 28 (twenty-eight) days. 07/10/22   Shanna Cisco, NP    Family History Family History  Problem Relation Age of Onset   Stroke Mother     Social History Social  History   Tobacco Use   Smoking status: Former    Types: Cigarettes   Smokeless tobacco: Never  Vaping Use   Vaping status: Never Used  Substance Use Topics   Alcohol use: Not Currently   Drug use: Not Currently    Types: Marijuana     Allergies   Lactose intolerance (gi)   Review of Systems Review of Systems  Neurological:  Positive for dizziness.     Physical Exam Triage Vital Signs ED Triage Vitals  Encounter Vitals Group     BP 08/19/22 1018 (!) 160/98     Systolic BP Percentile --      Diastolic BP Percentile --      Pulse Rate 08/19/22 1018 70     Resp 08/19/22 1018 16     Temp 08/19/22 1018 98 F (36.7 C)     Temp Source 08/19/22 1018 Oral     SpO2 08/19/22 1018 97 %     Weight --      Height --      Head Circumference --      Peak Flow --      Pain Score 08/19/22 1019 0     Pain Loc --      Pain Education --      Exclude from Growth Chart --  No data found.  Updated Vital Signs BP (!) 160/98 (BP Location: Left Arm)   Pulse 70   Temp 98 F (36.7 C) (Oral)   Resp 16   SpO2 97%   Visual Acuity Right Eye Distance:   Left Eye Distance:   Bilateral Distance:    Right Eye Near:   Left Eye Near:    Bilateral Near:     Physical Exam Vitals reviewed.  Constitutional:      General: He is not in acute distress.    Appearance: He is not ill-appearing, toxic-appearing or diaphoretic.  HENT:     Mouth/Throat:     Mouth: Mucous membranes are moist.  Eyes:     Extraocular Movements: Extraocular movements intact.     Pupils: Pupils are equal, round, and reactive to light.  Cardiovascular:     Rate and Rhythm: Normal rate and regular rhythm.     Heart sounds: No murmur heard. Pulmonary:     Effort: Pulmonary effort is normal.     Breath sounds: Normal breath sounds.  Skin:    Coloration: Skin is not pale.  Neurological:     General: No focal deficit present.     Mental Status: He is alert and oriented to person, place, and time.   Psychiatric:        Behavior: Behavior normal.      UC Treatments / Results  Labs (all labs ordered are listed, but only abnormal results are displayed) Labs Reviewed - No data to display  EKG   Radiology No results found.  Procedures Procedures (including critical care time)  Medications Ordered in UC Medications - No data to display  Initial Impression / Assessment and Plan / UC Course  I have reviewed the triage vital signs and the nursing notes.  Pertinent labs & imaging results that were available during my care of the patient were reviewed by me and considered in my medical decision making (see chart for details).        Blood pressure medications are refilled.  Staff will help him make a primary care appointment to establish Final Clinical Impressions(s) / UC Diagnoses   Final diagnoses:  Essential hypertension     Discharge Instructions      Continue your blood pressure medicines:  Clonidine 0.1 mg--2 tablets by mouth 2 times daily for blood pressure.  Diltiazem 120 mg--1 capsule once daily for blood pressure.  You can use the QR code/website at the back of the summary paperwork to schedule yourself a new patient appointment with primary care      ED Prescriptions     Medication Sig Dispense Auth. Provider   cloNIDine (CATAPRES) 0.1 MG tablet  (Status: Discontinued) Take 2 tablets (0.2 mg total) by mouth 2 (two) times daily. 60 tablet Derrek Puff, Janace Aris, MD   diltiazem (CARDIZEM CD) 120 MG 24 hr capsule Take 1 capsule (120 mg total) by mouth daily. 30 capsule Zenia Resides, MD   cloNIDine (CATAPRES) 0.1 MG tablet Take 2 tablets (0.2 mg total) by mouth 2 (two) times daily. 120 tablet Jamesyn Moorefield, Janace Aris, MD      PDMP not reviewed this encounter.   Zenia Resides, MD 08/19/22 [phone]

## 2022-09-06 NOTE — Progress Notes (Deleted)
[facility] [address] [place] [address] Dept: [phone] Dept Fax: [phone]  New Patient Office Visit  Subjective:   Philip Roth [birth year] 09/07/2022  No chief complaint on file.   HPI: [name] presents today to establish care at [facility] [employer]. Introduced to Publishing rights manager role and practice setting.  All questions answered.  Concerns: See below   Discussed the use of AI scribe software for clinical note transcription with the patient, who gave verbal consent to proceed.  History of Present Illness            The following portions of the patient's history were reviewed and updated as appropriate: past medical history, past surgical history, family history, social history, allergies, medications, and problem list.   Patient Active Problem List   Diagnosis Date Noted   Adjustment disorder with mixed anxiety and depressed mood 03/16/2022   Stable angina    Hypertensive urgency 04/16/2021   Past Medical History:  Diagnosis Date   Hypertension    Polysubstance abuse (HCC)    Tobacco abuse    No past surgical history on file. Family History  Problem Relation Age of Onset   Stroke Mother    Outpatient Medications Prior to Visit  Medication Sig Dispense Refill   aspirin 81 MG chewable tablet Chew 1 tablet (81 mg total) by mouth daily. 30 tablet 2   atorvastatin (LIPITOR) 20 MG tablet Take 1 tablet (20 mg total) by mouth daily. 30 tablet 2   cloNIDine (CATAPRES) 0.1 MG tablet Take 2 tablets (0.2 mg total) by mouth 2 (two) times daily. 120 tablet 2   diltiazem (CARDIZEM CD) 120 MG 24 hr capsule Take 1 capsule (120 mg total) by mouth daily. 30 capsule 2   hydrOXYzine (ATARAX) 10 MG tablet Take 1 tablet (10 mg total) by mouth 3 (three) times daily as needed. 90 tablet 3   risperiDONE ER (UZEDY) 50 MG/0.14ML SUSY Inject 50 mg into the skin every 28 (twenty-eight) days.  1.4 mL 11   Facility-Administered Medications Prior to Visit  Medication Dose Route Frequency Provider Last Rate Last Admin   risperiDONE ER SUSY 50 mg  50 mg Subcutaneous Q28 days Toy Cookey E, NP       Allergies  Allergen Reactions   Lactose Intolerance (Gi) Diarrhea    ROS: A complete ROS was performed with pertinent positives/negatives noted in the HPI. The remainder of the ROS are negative.   Objective:   There were no vitals filed for this visit.  GENERAL: Well-appearing, in NAD. Well nourished.  SKIN: Pink, warm and dry. No rash, lesion, ulceration, or ecchymoses.  NECK: Trachea midline. Full ROM w/o pain or tenderness. No lymphadenopathy.  RESPIRATORY: Chest wall symmetrical. Respirations even and non-labored. Breath sounds clear to auscultation bilaterally.  CARDIAC: S1, S2 present, regular rate and rhythm. Peripheral pulses 2+ bilaterally.  MSK: Muscle tone and strength appropriate for age. Joints w/o tenderness, redness, or swelling.  EXTREMITIES: Without clubbing, cyanosis, or edema.  NEUROLOGIC: No motor or sensory deficits. Steady, even gait.  PSYCH/MENTAL STATUS: Alert, oriented x 3. Cooperative, appropriate mood and affect.   Health Maintenance Due  Topic Date Due   Hepatitis C Screening  Never done   DTaP/Tdap/Td (1 - Tdap) Never done   Colonoscopy  Never done   Zoster Vaccines- Shingrix (1 of 2) Never done   COVID-19 Vaccine (1 - 2023-24 season) Never done   INFLUENZA VACCINE  08/23/2022  No results found for any visits on 09/07/22.  Assessment & Plan:  Assessment and Plan               There are no diagnoses linked to this encounter.   No follow-ups on file.   Of note, portions of this note may have been created with voice recognition software Physicist, medical). While this note has been edited for accuracy, occasional wrong-word or 'sound-a-like' substitutions may have occurred due to the inherent limitations of voice recognition  software.  Salvatore Decent, FNP

## 2022-09-07 ENCOUNTER — Telehealth: Payer: Self-pay

## 2022-09-07 ENCOUNTER — Ambulatory Visit: Payer: MEDICAID | Admitting: Internal Medicine

## 2022-09-07 NOTE — Telephone Encounter (Signed)
09/07/22 - Pt did not show up for Appt. Pt is Blocked from future schedule with all providers.

## 2022-10-02 ENCOUNTER — Ambulatory Visit (HOSPITAL_COMMUNITY)
Admission: EM | Admit: 2022-10-02 | Discharge: 2022-10-02 | Disposition: A | Discharge status: Home / Self Care | Payer: MEDICAID | Attending: Nurse Practitioner | Admitting: Nurse Practitioner

## 2022-10-02 ENCOUNTER — Other Ambulatory Visit: Payer: Self-pay

## 2022-10-02 ENCOUNTER — Encounter (HOSPITAL_COMMUNITY): Payer: Self-pay | Admitting: Emergency Medicine

## 2022-10-02 DIAGNOSIS — I10 Essential (primary) hypertension: Secondary | ICD-10-CM | POA: Diagnosis not present

## 2022-10-02 DIAGNOSIS — Z760 Encounter for issue of repeat prescription: Secondary | ICD-10-CM

## 2022-10-02 DIAGNOSIS — F4310 Post-traumatic stress disorder, unspecified: Secondary | ICD-10-CM

## 2022-10-02 DIAGNOSIS — Z0000 Encounter for general adult medical examination without abnormal findings: Secondary | ICD-10-CM

## 2022-10-02 MED ORDER — DILTIAZEM HCL ER COATED BEADS 120 MG PO CP24
120.0000 mg | ORAL_CAPSULE | Freq: Every day | ORAL | 0 refills | Status: DC
  Filled 2022-10-02: qty 30, 30d supply, fill #0

## 2022-10-02 MED ORDER — CLONIDINE HCL 0.1 MG PO TABS
0.2000 mg | ORAL_TABLET | Freq: Two times a day (BID) | ORAL | 0 refills | Status: DC
  Filled 2022-10-02: qty 120, 30d supply, fill #0

## 2022-10-02 NOTE — ED Triage Notes (Signed)
Pt has paper from plasma place that wants filled out regarding his medications and signed by Healthcare provider.  Dr Marlinda Mike showed paper and informed this RN that patient can be seen for his hypertension and medication refill if needed, but paper will not be filled out by [facility] provider here.  Made patient aware. Pt reports that he would like to be seen for his blood pressure and medications refill.

## 2022-10-02 NOTE — Discharge Instructions (Signed)
Please resume the blood pressure medications.    Call and make an appointment with a primary care provider to recheck your blood pressure and manage the high blood pressure.

## 2022-10-02 NOTE — ED Provider Notes (Signed)
[facility]    CSN: [id number] Arrival date & time: 10/02/22  1145      History   Chief Complaint Chief Complaint  Patient presents with   Hypertension   Medication Refill    HPI Philip Roth is a 57 y.o. male.   Patient presents today for blood pressure medication refill.  Reports he ran out of his medication a couple of weeks ago.  He denies chest pain, shortness of breath, blurred vision or double vision, or lower extremity swelling.  Reports he has had some headache and dizziness which is chronic for him.  He is requesting I fill out a form clearing him to give plasma today.  Apparently his blood pressure at the plasma facility was greater than 200 systolically over 110s diastolic.  He was seen in urgent care approximately 6 weeks ago and was prescribed his blood pressure medication which he was taking until he ran out.  He also had a appointment with a primary care provider after that visit but unfortunately no showed.    Past Medical History:  Diagnosis Date   Hypertension    Polysubstance abuse (HCC)    Tobacco abuse     Patient Active Problem List   Diagnosis Date Noted   Adjustment disorder with mixed anxiety and depressed mood 03/16/2022   Stable angina    Hypertensive urgency 04/16/2021    History reviewed. No pertinent surgical history.     Home Medications    Prior to Admission medications   Medication Sig Start Date End Date Taking? Authorizing Provider  aspirin 81 MG chewable tablet Chew 1 tablet (81 mg total) by mouth daily. 01/16/22   Debby Freiberg, NP  atorvastatin (LIPITOR) 20 MG tablet Take 1 tablet (20 mg total) by mouth daily. 02/17/22   Ward, Tylene Fantasia, PA-C  cloNIDine (CATAPRES) 0.1 MG tablet Take 2 tablets (0.2 mg total) by mouth 2 (two) times daily. 10/02/22   Valentino Nose, NP  diltiazem (CARDIZEM CD) 120 MG 24 hr capsule Take 1 capsule (120 mg total) by mouth daily. 10/02/22   Valentino Nose, NP  hydrOXYzine  (ATARAX) 10 MG tablet Take 1 tablet (10 mg total) by mouth 3 (three) times daily as needed. 07/10/22   Shanna Cisco, NP  risperiDONE ER (UZEDY) 50 MG/0.14ML SUSY Inject 50 mg into the skin every 28 (twenty-eight) days. 07/10/22   Shanna Cisco, NP    Family History Family History  Problem Relation Age of Onset   Stroke Mother     Social History Social History   Tobacco Use   Smoking status: Former    Types: Cigarettes   Smokeless tobacco: Never  Vaping Use   Vaping status: Never Used  Substance Use Topics   Alcohol use: Not Currently   Drug use: Not Currently    Types: Marijuana     Allergies   Lactose intolerance (gi)   Review of Systems Review of Systems Per HPI  Physical Exam Triage Vital Signs ED Triage Vitals  Encounter Vitals Group     BP 10/02/22 1309 (!) 183/83     Systolic BP Percentile --      Diastolic BP Percentile --      Pulse Rate 10/02/22 1309 72     Resp 10/02/22 1309 17     Temp --      Temp src --      SpO2 10/02/22 1309 98 %     Weight --  Height --      Head Circumference --      Peak Flow --      Pain Score 10/02/22 1313 0     Pain Loc --      Pain Education --      Exclude from Growth Chart --    No data found.  Updated Vital Signs BP (!) 183/83 (BP Location: Right Arm)   Pulse 72   Resp 17   SpO2 98%   Visual Acuity Right Eye Distance:   Left Eye Distance:   Bilateral Distance:    Right Eye Near:   Left Eye Near:    Bilateral Near:     Physical Exam Vitals and nursing note reviewed.  Constitutional:      General: He is not in acute distress.    Appearance: Normal appearance. He is obese. He is not ill-appearing or diaphoretic.  HENT:     Mouth/Throat:     Mouth: Mucous membranes are moist.     Pharynx: Oropharynx is clear.  Eyes:     General: No scleral icterus.    Extraocular Movements: Extraocular movements intact.  Cardiovascular:     Rate and Rhythm: Normal rate and regular rhythm.   Pulmonary:     Effort: Pulmonary effort is normal. No respiratory distress.     Breath sounds: Normal breath sounds. No wheezing, rhonchi or rales.  Abdominal:     General: Abdomen is flat. Bowel sounds are normal. There is no distension.  Musculoskeletal:        General: Normal range of motion.     Cervical back: Normal range of motion.     Right lower leg: No edema.     Left lower leg: No edema.  Skin:    General: Skin is warm and dry.     Coloration: Skin is not jaundiced or pale.  Neurological:     General: No focal deficit present.     Mental Status: He is alert and oriented to person, place, and time.     Motor: No weakness.     Gait: Gait normal.  Psychiatric:        Mood and Affect: Mood normal.        Behavior: Behavior normal.        Thought Content: Thought content normal.        Judgment: Judgment normal.      UC Treatments / Results  Labs (all labs ordered are listed, but only abnormal results are displayed) Labs Reviewed - No data to display  EKG   Radiology No results found.  Procedures Procedures (including critical care time)  Medications Ordered in UC Medications - No data to display  Initial Impression / Assessment and Plan / UC Course  I have reviewed the triage vital signs and the nursing notes.  Pertinent labs & imaging results that were available during my care of the patient were reviewed by me and considered in my medical decision making (see chart for details).   Patient is well-appearing, not tachycardic, not tachypneic, oxygenating well on room air.  Patient is hypertensive in urgent care today.  1. Encounter for medication refill 2. Essential hypertension Refills given for clonidine 0.1 mg 2 tablets twice daily, diltiazem extended release 120 mg daily Recommended follow-up and establishing care with PCP for further monitoring and treatment of blood pressure and contact information provided today Strict ER precautions reviewed with  patient  The patient was given the opportunity to ask questions.  All questions answered to their satisfaction.  The patient is in agreement to this plan.    Final Clinical Impressions(s) / UC Diagnoses   Final diagnoses:  Encounter for medication refill  Essential hypertension     Discharge Instructions      Please resume the blood pressure medications.    Call and make an appointment with a primary care provider to recheck your blood pressure and manage the high blood pressure.     ED Prescriptions     Medication Sig Dispense Auth. Provider   cloNIDine (CATAPRES) 0.1 MG tablet Take 2 tablets (0.2 mg total) by mouth 2 (two) times daily. 120 tablet Cathlean Marseilles A, NP   diltiazem (CARDIZEM CD) 120 MG 24 hr capsule Take 1 capsule (120 mg total) by mouth daily. 30 capsule Valentino Nose, NP      PDMP not reviewed this encounter.   Valentino Nose, NP 10/02/22 1358

## 2022-10-09 ENCOUNTER — Encounter (HOSPITAL_COMMUNITY): Payer: MEDICAID | Admitting: Psychiatry

## 2022-10-15 ENCOUNTER — Encounter (HOSPITAL_COMMUNITY): Payer: MEDICAID | Admitting: Psychiatry

## 2022-10-15 NOTE — Telephone Encounter (Signed)
Error - duplicate

## 2023-03-19 ENCOUNTER — Encounter (HOSPITAL_COMMUNITY): Payer: Self-pay | Admitting: Emergency Medicine

## 2023-03-19 ENCOUNTER — Other Ambulatory Visit: Payer: Self-pay

## 2023-03-19 ENCOUNTER — Emergency Department (HOSPITAL_COMMUNITY)
Admission: EM | Admit: 2023-03-19 | Discharge: 2023-03-19 | Disposition: A | Discharge status: Home / Self Care | Payer: MEDICAID | Attending: Emergency Medicine | Admitting: Emergency Medicine

## 2023-03-19 DIAGNOSIS — E860 Dehydration: Secondary | ICD-10-CM | POA: Insufficient documentation

## 2023-03-19 DIAGNOSIS — R42 Dizziness and giddiness: Secondary | ICD-10-CM | POA: Diagnosis present

## 2023-03-19 LAB — BASIC METABOLIC PANEL
Anion gap: 9 (ref 5–15)
BUN: 5 mg/dL — ABNORMAL LOW (ref 6–20)
CO2: 26 mmol/L (ref 22–32)
Calcium: 9.3 mg/dL (ref 8.9–10.3)
Chloride: 103 mmol/L (ref 98–111)
Creatinine, Ser: 1.03 mg/dL (ref 0.61–1.24)
GFR, Estimated: >60 mL/min (ref >60)
Glucose, Bld: 107 mg/dL — ABNORMAL HIGH (ref 70–99)
Potassium: 3.4 mmol/L — ABNORMAL LOW (ref 3.5–5.1)
Sodium: 138 mmol/L (ref 135–145)

## 2023-03-19 LAB — URINALYSIS, ROUTINE W REFLEX MICROSCOPIC
Bilirubin Urine: NEGATIVE
Glucose, UA: NEGATIVE mg/dL
Hgb urine dipstick: NEGATIVE
Ketones, ur: NEGATIVE mg/dL
Leukocytes,Ua: NEGATIVE
Nitrite: NEGATIVE
Protein, ur: NEGATIVE mg/dL
Specific Gravity, Urine: 1.001 — ABNORMAL LOW (ref 1.005–1.030)
pH: 6 (ref 5.0–8.0)

## 2023-03-19 LAB — CBC
HCT: 42.4 % (ref 39.0–52.0)
Hemoglobin: 14 g/dL (ref 13.0–17.0)
MCH: 29.5 pg (ref 26.0–34.0)
MCHC: 33 g/dL (ref 30.0–36.0)
MCV: 89.3 fL (ref 80.0–100.0)
Platelets: 262 10*3/uL (ref 150–400)
RBC: 4.75 MIL/uL (ref 4.22–5.81)
RDW: 12.5 % (ref 11.5–15.5)
WBC: 10.4 10*3/uL (ref 4.0–10.5)
nRBC: 0 % (ref 0.0–0.2)

## 2023-03-19 LAB — CBG MONITORING, ED: Glucose-Capillary: 99 mg/dL (ref 70–99)

## 2023-03-19 NOTE — ED Triage Notes (Signed)
 Pt in with dizziness x 3 days, +HA and states dizziness worse when standing. No cp

## 2023-03-19 NOTE — ED Provider Notes (Signed)
 [place] [facility] AT [facility] Provider Note   CSN: [id number] Arrival date & time: 03/19/23  0136     History Chief Complaint  Patient presents with   Dizziness    HPI Philip Roth is a 58 y.o. male presenting for chief complaint of dizziness.  States he has had 3 days of intermittent dizziness.  States has been on and off for years but last night when he was walking around he states he had a acute episode slightly worse than normal.  He had a 14-hour wait to be seen today and all symptoms have been resolved for over 10 hours. Denies fevers chills nausea vomiting syncope shortness of breath. By the time my evaluation, patient is asking when he can be discharged.  Patient's recorded medical, surgical, social, medication list and allergies were reviewed in the Snapshot window as part of the initial history.   Review of Systems   Review of Systems  Constitutional:  Negative for chills and fever.  HENT:  Negative for ear pain and sore throat.   Eyes:  Negative for pain and visual disturbance.  Respiratory:  Negative for cough and shortness of breath.   Cardiovascular:  Negative for chest pain and palpitations.  Gastrointestinal:  Negative for abdominal pain and vomiting.  Genitourinary:  Negative for dysuria and hematuria.  Musculoskeletal:  Negative for arthralgias and back pain.  Skin:  Negative for color change and rash.  Neurological:  Positive for dizziness. Negative for seizures and syncope.  Psychiatric/Behavioral:  Negative for agitation. The patient is not nervous/anxious.   All other systems reviewed and are negative.   Physical Exam Updated Vital Signs BP (!) 186/99   Pulse 65   Temp 97.9 F (36.6 C) (Oral)   Resp 17   Wt 71.5 kg   SpO2 99%   BMI 23.97 kg/m  Physical Exam Vitals and nursing note reviewed.  Constitutional:      General: He is not in acute distress.    Appearance: He is well-developed.  HENT:     Head:  Normocephalic and atraumatic.  Eyes:     Conjunctiva/sclera: Conjunctivae normal.  Cardiovascular:     Rate and Rhythm: Normal rate and regular rhythm.     Heart sounds: No murmur heard. Pulmonary:     Effort: Pulmonary effort is normal. No respiratory distress.     Breath sounds: Normal breath sounds.  Abdominal:     Palpations: Abdomen is soft.     Tenderness: There is no abdominal tenderness.  Musculoskeletal:        General: No swelling.     Cervical back: Neck supple.  Skin:    General: Skin is warm and dry.     Capillary Refill: Capillary refill takes less than 2 seconds.  Neurological:     Mental Status: He is alert.  Psychiatric:        Mood and Affect: Mood normal.      ED Course/ Medical Decision Making/ A&P    Procedures Procedures   Medications Ordered in ED Medications - No data to display Medical Decision Making:   Philip Roth is a 58 y.o. male who presented to the ED today with dizziness detailed above.     Complete initial physical exam performed, notably the patient  was HDS in NAD.    Currently asymptomatic  Reviewed and confirmed nursing documentation for past medical history, family history, social history.    Initial Assessment:   With the patient's presentation of episodic,  intermittent dizziness that is worsened with motion activity, most likely diagnosis is BPPV. Other diagnoses were considered including (but not limited to) CVA, Labyrinthitis, Vestibular Neuritis, Mnire's disease, occipital migraine, Inner Ear infection. These are considered less likely due to history of present illness and physical exam findings.   This is most consistent with an acute life/limb threatening illness complicated by underlying chronic conditions. Notably, due to duration of symptoms being greater than 4 and half hours, patient is not a candidate for code stroke activation.  Initial Plan:  Screening labs including CBC and Metabolic panel to evaluate for  infectious or metabolic etiology of disease.  Urinalysis with reflex culture ordered to evaluate for UTI or relevant urologic/nephrologic pathology.  CXR to evaluate for structural/infectious intrathoracic pathology.  EKG to evaluate for cardiac pathology Objective evaluation as below reviewed with plan for reassessment after administration of Meclizine.  Initial Study Results:   Laboratory  All laboratory results reviewed without evidence of clinically relevant pathology.   EKG EKG was reviewed independently. Rate, rhythm, axis, intervals all examined and without medically relevant abnormality. ST segments without concerns for elevations.    Radiology:  All images reviewed independently. Agree with radiology report at this time.   No results found.    Reassessment and Plan:   Patient observed in the ER for 14 hours in totality with complete symptomatic resolution.  He is ambulatory tolerating p.o. intake requesting discharge. Likely episodic vertigo but given resolution no acute indication for further intervention. Discussed supportive care in the outpatient setting and patient was in agreement with plan.   Disposition:  I have considered need for hospitalization, however, considering all of the above, I believe this patient is stable for discharge at this time.  Patient/family educated about specific return precautions for given chief complaint and symptoms.  Patient/family educated about follow-up with PCP.     Patient/family expressed understanding of return precautions and need for follow-up. Patient spoken to regarding all imaging and laboratory results and appropriate follow up for these results. All education provided in verbal form with additional information in written form. Time was allowed for answering of patient questions. Patient discharged.    Emergency Department Medication Summary:   Medications - No data to display    Clinical Impression:  1. Dehydration   2.  Vertigo      Discharge   Final Clinical Impression(s) / ED Diagnoses Final diagnoses:  Dehydration  Vertigo    Rx / DC Orders ED Discharge Orders     None         Glyn Ade, MD 03/19/23 1541

## 2023-03-19 NOTE — ED Notes (Signed)
Pt not responding  to be roomed  

## 2023-03-19 NOTE — ED Notes (Signed)
 Got patient into a gown on the monitor patient is resting with call bell in reach

## 2023-03-19 NOTE — ED Notes (Signed)
 Pt is alert and oriented. DC papers given to patient. MD Countryman made aware of patients BP of 200/106 (131), provider okay to continue to DC. Pt aware he needs to follow up with provider on DC paperwork for further BP management.

## 2023-03-30 ENCOUNTER — Other Ambulatory Visit: Payer: Self-pay

## 2023-03-30 ENCOUNTER — Encounter (HOSPITAL_COMMUNITY): Payer: Self-pay | Admitting: Emergency Medicine

## 2023-03-30 ENCOUNTER — Ambulatory Visit (HOSPITAL_COMMUNITY)
Admission: EM | Admit: 2023-03-30 | Discharge: 2023-03-30 | Disposition: A | Discharge status: Home / Self Care | Payer: MEDICAID | Attending: Physician Assistant | Admitting: Physician Assistant

## 2023-03-30 DIAGNOSIS — Z0000 Encounter for general adult medical examination without abnormal findings: Secondary | ICD-10-CM

## 2023-03-30 DIAGNOSIS — F4310 Post-traumatic stress disorder, unspecified: Secondary | ICD-10-CM

## 2023-03-30 DIAGNOSIS — Z760 Encounter for issue of repeat prescription: Secondary | ICD-10-CM

## 2023-03-30 DIAGNOSIS — I10 Essential (primary) hypertension: Secondary | ICD-10-CM

## 2023-03-30 MED ORDER — DILTIAZEM HCL ER COATED BEADS 120 MG PO CP24: 120.0000 mg | ORAL_CAPSULE | Freq: Every day | ORAL | 0 refills | Status: DC

## 2023-03-30 MED ORDER — ATORVASTATIN CALCIUM 20 MG PO TABS: 20.0000 mg | ORAL_TABLET | Freq: Every day | ORAL | 2 refills | Status: AC

## 2023-03-30 MED ORDER — CLONIDINE HCL 0.1 MG PO TABS: 0.2000 mg | ORAL_TABLET | Freq: Two times a day (BID) | ORAL | 0 refills | Status: DC

## 2023-03-30 NOTE — Discharge Instructions (Signed)
 I have refilled your medications Please follow up with primary care for further refills

## 2023-03-30 NOTE — ED Triage Notes (Signed)
 Attempted to call for triage x 2 with no respond.

## 2023-03-30 NOTE — ED Provider Notes (Signed)
 [facility]    CSN: [id number] Arrival date & time: 03/30/23  1134      History   Chief Complaint Chief Complaint  Patient presents with   Hypertension    HPI Philip Roth is a 58 y.o. male.   Patient reports over the last few weeks he has had intermittent head aches.  He reports sometimes he also feels dizzy.  Reports he has been out of his medications for about a month.  He does not have a PCP.  He is out of clonidine diltiazem and atorvastatin.  Patient reports he has not taken his risperidone in several months.  At this time he is asymptomatic.    Past Medical History:  Diagnosis Date   Hypertension    Polysubstance abuse (HCC)    Tobacco abuse     Patient Active Problem List   Diagnosis Date Noted   Adjustment disorder with mixed anxiety and depressed mood 03/16/2022   Stable angina (HCC)    Hypertensive urgency 04/16/2021    History reviewed. No pertinent surgical history.     Home Medications    Prior to Admission medications   Medication Sig Start Date End Date Taking? Authorizing Provider  aspirin 81 MG chewable tablet Chew 1 tablet (81 mg total) by mouth daily. 01/16/22   Debby Freiberg, NP  atorvastatin (LIPITOR) 20 MG tablet Take 1 tablet (20 mg total) by mouth daily. 03/30/23   Ward, Tylene Fantasia, PA-C  cloNIDine (CATAPRES) 0.1 MG tablet Take 2 tablets (0.2 mg total) by mouth 2 (two) times daily. 03/30/23   Ward, Tylene Fantasia, PA-C  diltiazem (CARDIZEM CD) 120 MG 24 hr capsule Take 1 capsule (120 mg total) by mouth daily. 03/30/23   Ward, Tylene Fantasia, PA-C  hydrOXYzine (ATARAX) 10 MG tablet Take 1 tablet (10 mg total) by mouth 3 (three) times daily as needed. 07/10/22   Shanna Cisco, NP  risperiDONE ER (UZEDY) 50 MG/0.14ML SUSY Inject 50 mg into the skin every 28 (twenty-eight) days. 07/10/22   Shanna Cisco, NP    Family History Family History  Problem Relation Age of Onset   Stroke Mother     Social History Social History    Tobacco Use   Smoking status: Every Day    Types: Cigarettes   Smokeless tobacco: Never  Vaping Use   Vaping status: Every Day  Substance Use Topics   Alcohol use: Not Currently   Drug use: Not Currently    Types: Marijuana     Allergies   Lactose intolerance (gi)   Review of Systems Review of Systems  Constitutional:  Negative for chills and fever.  HENT:  Negative for ear pain and sore throat.   Eyes:  Negative for pain and visual disturbance.  Respiratory:  Negative for cough and shortness of breath.   Cardiovascular:  Negative for chest pain and palpitations.  Gastrointestinal:  Negative for abdominal pain and vomiting.  Genitourinary:  Negative for dysuria and hematuria.  Musculoskeletal:  Negative for arthralgias and back pain.  Skin:  Negative for color change and rash.  Neurological:  Positive for headaches. Negative for seizures and syncope.  All other systems reviewed and are negative.    Physical Exam Triage Vital Signs ED Triage Vitals  Encounter Vitals Group     BP 03/30/23 1334 (!) 145/88     Systolic BP Percentile --      Diastolic BP Percentile --      Pulse Rate 03/30/23 1334 87  Resp 03/30/23 1334 20     Temp 03/30/23 1334 98.1 F (36.7 C)     Temp Source 03/30/23 1334 Oral     SpO2 03/30/23 1334 97 %     Weight --      Height --      Head Circumference --      Peak Flow --      Pain Score 03/30/23 1331 0     Pain Loc --      Pain Education --      Exclude from Growth Chart --    No data found.  Updated Vital Signs BP (!) 145/88 (BP Location: Left Arm)   Pulse 87   Temp 98.1 F (36.7 C) (Oral)   Resp 20   SpO2 97%   Visual Acuity Right Eye Distance:   Left Eye Distance:   Bilateral Distance:    Right Eye Near:   Left Eye Near:    Bilateral Near:     Physical Exam Vitals and nursing note reviewed.  Constitutional:      General: He is not in acute distress.    Appearance: He is well-developed.  HENT:     Head:  Normocephalic and atraumatic.  Eyes:     Conjunctiva/sclera: Conjunctivae normal.  Cardiovascular:     Rate and Rhythm: Normal rate and regular rhythm.     Heart sounds: No murmur heard. Pulmonary:     Effort: Pulmonary effort is normal. No respiratory distress.     Breath sounds: Normal breath sounds.  Abdominal:     Palpations: Abdomen is soft.     Tenderness: There is no abdominal tenderness.  Musculoskeletal:        General: No swelling.     Cervical back: Neck supple.  Skin:    General: Skin is warm and dry.     Capillary Refill: Capillary refill takes less than 2 seconds.  Neurological:     Mental Status: He is alert.  Psychiatric:        Mood and Affect: Mood normal.      UC Treatments / Results  Labs (all labs ordered are listed, but only abnormal results are displayed) Labs Reviewed - No data to display  EKG   Radiology No results found.  Procedures Procedures (including critical care time)  Medications Ordered in UC Medications - No data to display  Initial Impression / Assessment and Plan / UC Course  I have reviewed the triage vital signs and the nursing notes.  Pertinent labs & imaging results that were available during my care of the patient were reviewed by me and considered in my medical decision making (see chart for details).     Will refill medications today.  It appears patient has been getting all of his refills of these medications from urgent care.  He has not had a refill of his risperidone in some time.  Discussed the importance of finding a primary care physician.  PCP assistance order put in.  Given schedule for mobile clinic.   Final Clinical Impressions(s) / UC Diagnoses   Final diagnoses:  Medication refill     Discharge Instructions      I have refilled your medications Please follow up with primary care for further refills   ED Prescriptions     Medication Sig Dispense Auth. Provider   atorvastatin (LIPITOR) 20 MG  tablet Take 1 tablet (20 mg total) by mouth daily. 30 tablet Ward, Shanda Bumps Z, PA-C   cloNIDine (CATAPRES) 0.1  MG tablet Take 2 tablets (0.2 mg total) by mouth 2 (two) times daily. 120 tablet Ward, Tylene Fantasia, PA-C   diltiazem (CARDIZEM CD) 120 MG 24 hr capsule Take 1 capsule (120 mg total) by mouth daily. 30 capsule Ward, Tylene Fantasia, PA-C      PDMP not reviewed this encounter.   Ward, Tylene Fantasia, PA-C 03/30/23 [phone]

## 2023-03-30 NOTE — ED Triage Notes (Signed)
 Reports slight headache, chills and dizziness started 2 weeks ago.  Reports he is out of blood pressure medicines for a month or two.    Patient reports he does not have a regular medical doctor

## 2023-06-12 ENCOUNTER — Encounter (HOSPITAL_COMMUNITY): Payer: Self-pay

## 2023-06-12 ENCOUNTER — Other Ambulatory Visit: Payer: Self-pay

## 2023-06-12 ENCOUNTER — Emergency Department (HOSPITAL_COMMUNITY): Payer: MEDICAID

## 2023-06-12 ENCOUNTER — Encounter (HOSPITAL_COMMUNITY): Payer: Self-pay | Admitting: Physician Assistant

## 2023-06-12 ENCOUNTER — Ambulatory Visit (INDEPENDENT_AMBULATORY_CARE_PROVIDER_SITE_OTHER): Payer: MEDICAID | Admitting: Physician Assistant

## 2023-06-12 ENCOUNTER — Emergency Department (HOSPITAL_COMMUNITY)
Admission: EM | Admit: 2023-06-12 | Discharge: 2023-06-12 | Disposition: A | Discharge status: Home / Self Care | Payer: MEDICAID | Attending: Emergency Medicine | Admitting: Emergency Medicine

## 2023-06-12 VITALS — BP 185/107 | HR 74 | Temp 98.1°F | Ht 68.0 in | Wt 160.8 lb

## 2023-06-12 DIAGNOSIS — R079 Chest pain, unspecified: Secondary | ICD-10-CM

## 2023-06-12 DIAGNOSIS — I10 Essential (primary) hypertension: Secondary | ICD-10-CM | POA: Diagnosis not present

## 2023-06-12 DIAGNOSIS — F411 Generalized anxiety disorder: Secondary | ICD-10-CM

## 2023-06-12 DIAGNOSIS — Z79899 Other long term (current) drug therapy: Secondary | ICD-10-CM | POA: Insufficient documentation

## 2023-06-12 DIAGNOSIS — F4310 Post-traumatic stress disorder, unspecified: Secondary | ICD-10-CM

## 2023-06-12 DIAGNOSIS — Z7982 Long term (current) use of aspirin: Secondary | ICD-10-CM | POA: Insufficient documentation

## 2023-06-12 DIAGNOSIS — Z758 Other problems related to medical facilities and other health care: Secondary | ICD-10-CM | POA: Diagnosis not present

## 2023-06-12 DIAGNOSIS — F250 Schizoaffective disorder, bipolar type: Secondary | ICD-10-CM

## 2023-06-12 LAB — COMPREHENSIVE METABOLIC PANEL WITH GFR
ALT: 14 U/L (ref 0–44)
AST: 26 U/L (ref 15–41)
Albumin: 4 g/dL (ref 3.5–5.0)
Alkaline Phosphatase: 53 U/L (ref 38–126)
Anion gap: 5 (ref 5–15)
BUN: 9 mg/dL (ref 6–20)
CO2: 26 mmol/L (ref 22–32)
Calcium: 9.1 mg/dL (ref 8.9–10.3)
Chloride: 106 mmol/L (ref 98–111)
Creatinine, Ser: 0.95 mg/dL (ref 0.61–1.24)
GFR, Estimated: >60 mL/min (ref >60)
Glucose, Bld: 91 mg/dL (ref 70–99)
Potassium: 4.1 mmol/L (ref 3.5–5.1)
Sodium: 137 mmol/L (ref 135–145)
Total Bilirubin: 0.9 mg/dL (ref 0.0–1.2)
Total Protein: 7.2 g/dL (ref 6.5–8.1)

## 2023-06-12 LAB — CBC
HCT: 45.7 % (ref 39.0–52.0)
Hemoglobin: 15 g/dL (ref 13.0–17.0)
MCH: 29.4 pg (ref 26.0–34.0)
MCHC: 32.8 g/dL (ref 30.0–36.0)
MCV: 89.6 fL (ref 80.0–100.0)
Platelets: 293 10*3/uL (ref 150–400)
RBC: 5.1 MIL/uL (ref 4.22–5.81)
RDW: 12.8 % (ref 11.5–15.5)
WBC: 7.3 10*3/uL (ref 4.0–10.5)
nRBC: 0 % (ref 0.0–0.2)

## 2023-06-12 LAB — TROPONIN I (HIGH SENSITIVITY)
Troponin I (High Sensitivity): 9 ng/L (ref <18)
Troponin I (High Sensitivity): 8 ng/L (ref <18)

## 2023-06-12 MED ORDER — HYDROXYZINE HCL 10 MG PO TABS: 10.0000 mg | ORAL_TABLET | Freq: Three times a day (TID) | ORAL | 3 refills | Status: DC | PRN

## 2023-06-12 MED ORDER — UZEDY 50 MG/0.14ML ~~LOC~~ SUSY: 50.0000 mg | PREFILLED_SYRINGE | SUBCUTANEOUS | 11 refills | Status: DC

## 2023-06-12 MED ORDER — DILTIAZEM HCL ER COATED BEADS 120 MG PO CP24
120.0000 mg | ORAL_CAPSULE | Freq: Once | ORAL | Status: AC
  Administered 2023-06-12: 120 mg via ORAL
  Filled 2023-06-12 (×3): qty 1

## 2023-06-12 MED ORDER — RISPERIDONE 1 MG PO TABS: 1.0000 mg | ORAL_TABLET | Freq: Every day | ORAL | 0 refills | Status: DC

## 2023-06-12 MED ORDER — DILTIAZEM HCL ER COATED BEADS 120 MG PO CP24: 120.0000 mg | ORAL_CAPSULE | Freq: Every day | ORAL | 1 refills | Status: DC

## 2023-06-12 MED ORDER — CLONIDINE HCL 0.2 MG PO TABS
0.2000 mg | ORAL_TABLET | Freq: Once | ORAL | Status: AC
  Administered 2023-06-12: 0.2 mg via ORAL
  Filled 2023-06-12: qty 1

## 2023-06-12 MED ORDER — CLONIDINE HCL 0.1 MG PO TABS: 0.2000 mg | ORAL_TABLET | Freq: Two times a day (BID) | ORAL | 0 refills | Status: DC

## 2023-06-12 NOTE — ED Provider Triage Note (Signed)
 Emergency Medicine Provider Triage Evaluation Note  Philip Roth , a 58 y.o. male  was evaluated in triage.  Pt complains of high blood pressure, chest pain comes and goes but seems to occur with ongoing walking, not currently having chest pain at this time, headache.  Been out of his blood pressure medications for 6 weeks..  Review of Systems  Positive: Headache, chest pain, mild dyspnea Negative: Fever, chills, trauma, blood thinners  Physical Exam  BP (!) 192/105 (BP Location: Right Arm)   Pulse 71   Temp 98.2 F (36.8 C) (Oral)   Resp 18   Ht 1.727 m (5\' 8" )   Wt 72.6 kg   SpO2 99%   BMI 24.33 kg/m  Gen:   Awake, no distress   Resp:  Normal effort  MSK:   Moves extremities without difficulty  Other:  Heart is regular rate and rhythm  Medical Decision Making  Medically screening exam initiated at 9:58 AM.  Appropriate orders placed.  Philip Roth was informed that the remainder of the evaluation will be completed by another provider, this initial triage assessment does not replace that evaluation, and the importance of remaining in the ED until their evaluation is complete.   Will place labs CBC, metabolic panel, CT head, chest x-Candido Flott, troponins Informed this is the beginning of his evaluation and he will have further evaluation and should stay for the remainder of his complete evaluation and results of tests.   Auston Blush, MD 06/12/23 1003

## 2023-06-12 NOTE — Discharge Instructions (Addendum)
 You were given a dose of your clonidine  and Cardizem  CD in the emergency department.  We are also refilling your Cardizem  and your clonidine  was already sent to your pharmacy.  It is very important to take these as prescribed.  It is also very important to follow-up with a primary care physician for long-term blood pressure control.  If you develop new or worsening chest pain, new or worsening headache, shortness of breath, weakness or numbness in your extremities, or any other new/concerning symptoms the return to the ER.

## 2023-06-12 NOTE — ED Triage Notes (Signed)
 Pt. Stated, Ive had chest pain, SOB and headache for 2 days. This is new. Denies any other symptoms.

## 2023-06-12 NOTE — Progress Notes (Signed)
 [facility] MD/PA/NP OP Progress Note  06/12/2023 9:08 AM Philip Roth  MRN:  [id number]  Chief Complaint:  Chief Complaint  Patient presents with   Establish Care   Medication Management   HPI:   Philip Roth is a 58 year old male with a past psychiatric history significant for anxiety, PTSD, and schizoaffective disorder (bipolar type) who presents to [facility] to reestablish psychiatric care for medication management.  Patient was last seen by Dr. Melisa Spray on 07/10/2022.  During his last encounter, patient was being managed on the following psychiatric medications:  Risperidone  ER (Uzedy ) 50 mg/0.14 mL SUSY Hydroxyzine  10 mg 3 times daily as needed  Patient presents to the encounter stating that he needs to be back on his medication.  He reports that he is unsure of his past psychiatric diagnoses.  He endorses some anxiety and states that he has not been receiving good rest.  Patient rates his anxiety as 6 or 7 out of 10.  He reports that he recently lost his job and states that the way he lost his job was questionable.  He reports that he was accused of stealing things and will subsequently let go.  Patient denies experiencing any panic attacks.  He further denied any symptoms of depression.  He denies paranoia.  He reports that he has experienced delusions in the past, but he is not experiencing any at this time.  Patient further denies auditory or visual hallucinations.  Patient denies any other stressors at this time and currently does not have a primary care provider.  A PHQ-9 screen was performed with the patient scoring a 19.  A GAD-7 screen was also performed with the patient scoring a 12.  Patient is alert and oriented x 4, calm, cooperative, and fully engaged in conversation during the encounter.  Patient states that his mood is better now that he has had this appointment.  The patient exhibits depressed and anxious mood with appropriate affect.   Patient denies suicidal or homicidal ideations.  He further denies auditory or visual hallucinations and does not appear to be responding to internal/external stimuli.  Patient endorses poor sleep (on average 3 to 3 hours of sleep per night.  Patient endorses fair appetite needs on average 2 meals per day.  Patient endorses alcohol consumption sparingly.  Patient denies tobacco use or illicit drug use.  Visit Diagnosis:    ICD-10-CM   1. Does not have primary care provider  Z75.8 Ambulatory referral to Internal Medicine    2. Primary hypertension  I10 cloNIDine  (CATAPRES ) 0.1 MG tablet    3. PTSD (post-traumatic stress disorder)  F43.10 cloNIDine  (CATAPRES ) 0.1 MG tablet    4. Anxiety state  F41.1 hydrOXYzine  (ATARAX ) 10 MG tablet    5. Schizoaffective disorder, bipolar type (HCC)  F25.0 risperiDONE  ER (UZEDY ) 50 MG/0.14ML SUSY    risperiDONE  (RISPERDAL ) 1 MG tablet      Past Psychiatric History:  Patient has a documented psychiatric history of PTSD, anxiety, and Schizoaffective disorder  Past Medical History:  Past Medical History:  Diagnosis Date   Hypertension    Polysubstance abuse (HCC)    Tobacco abuse    History reviewed. No pertinent surgical history.  Family Psychiatric History:  PTSD, Bipolar depression, alcohol use, cocaine use (in remission), hospitalized in the 90s [place] 4 to 5 years [facility] [place]   Family History:  Family History  Problem Relation Age of Onset   Stroke Mother  Social History:  Social History   Socioeconomic History   Marital status: Single    Spouse name: Not on file   Number of children: Not on file   Years of education: Not on file   Highest education level: Not on file  Occupational History   Not on file  Tobacco Use   Smoking status: Every Day    Types: Cigarettes   Smokeless tobacco: Never  Vaping Use   Vaping status: Every Day  Substance and Sexual Activity   Alcohol use: Not Currently   Drug use:  Not Currently    Types: Marijuana   Sexual activity: Not on file  Other Topics Concern   Not on file  Social History Narrative   Not on file   Social Drivers of Health   Financial Resource Strain: Not on file  Food Insecurity: Not on file  Transportation Needs: Not on file  Physical Activity: Not on file  Stress: Not on file  Social Connections: Not on file    Allergies:  Allergies  Allergen Reactions   Lactose Intolerance (Gi) Diarrhea    Metabolic Disorder Labs: Lab Results  Component Value Date   HGBA1C 4.7 (L) 04/16/2021   MPG 88.19 04/16/2021   No results found for: "PROLACTIN" Lab Results  Component Value Date   CHOL 239 (H) 04/16/2021   TRIG 56 04/16/2021   HDL 52 04/16/2021   CHOLHDL 4.6 04/16/2021   VLDL 11 04/16/2021   LDLCALC 176 (H) 04/16/2021   Lab Results  Component Value Date   TSH 1.116 04/16/2021    Therapeutic Level Labs: No results found for: "LITHIUM" No results found for: "VALPROATE" No results found for: "CBMZ"  Current Medications: Current Outpatient Medications  Medication Sig Dispense Refill   risperiDONE  (RISPERDAL ) 1 MG tablet Take 1 tablet (1 mg total) by mouth at bedtime. 30 tablet 0   aspirin  81 MG chewable tablet Chew 1 tablet (81 mg total) by mouth daily. 30 tablet 2   atorvastatin  (LIPITOR) 20 MG tablet Take 1 tablet (20 mg total) by mouth daily. 30 tablet 2   cloNIDine  (CATAPRES ) 0.1 MG tablet Take 2 tablets (0.2 mg total) by mouth 2 (two) times daily. 120 tablet 0   diltiazem  (CARDIZEM  CD) 120 MG 24 hr capsule Take 1 capsule (120 mg total) by mouth daily. 30 capsule 0   hydrOXYzine  (ATARAX ) 10 MG tablet Take 1 tablet (10 mg total) by mouth 3 (three) times daily as needed. 90 tablet 3   risperiDONE  ER (UZEDY ) 50 MG/0.14ML SUSY Inject 0.14 mLs (50 mg total) into the skin every 28 (twenty-eight) days. 1.4 mL 11   No current facility-administered medications for this visit.     Musculoskeletal: Strength & Muscle Tone:  within normal limits Gait & Station: normal Patient leans: N/A  Psychiatric Specialty Exam: Review of Systems  Psychiatric/Behavioral:  Positive for dysphoric mood and sleep disturbance. Negative for decreased concentration, hallucinations, self-injury and suicidal ideas. The patient is nervous/anxious. The patient is not hyperactive.     Blood pressure (!) 185/107, pulse 74, temperature 98.1 F (36.7 C), temperature source Oral, height 5\' 8"  (1.727 m), weight 160 lb 12.8 oz (72.9 kg), SpO2 98%.Body mass index is 24.45 kg/m.  General Appearance: Casual  Eye Contact:  Good  Speech:  Clear and Coherent and Normal Rate  Volume:  Normal  Mood:  Anxious and Depressed  Affect:  Appropriate  Thought Process:  Coherent, Goal Directed, and Descriptions of Associations: Intact  Orientation:  Full (  Time, Place, and Person)  Thought Content: WDL   Suicidal Thoughts:  No  Homicidal Thoughts:  No  Memory:  Immediate;   Good Recent;   Good Remote;   Fair  Judgement:  Good  Insight:  Good  Psychomotor Activity:  Normal  Concentration:  Concentration: Good and Attention Span: Good  Recall:  Good  Fund of Knowledge: Good  Language: Good  Akathisia:  No  Handed:  Right  AIMS (if indicated): done; 0  Assets:  Communication Skills Desire for Improvement Social Support  ADL's:  Intact  Cognition: WNL  Sleep:  Poor   Screenings: AIMS    Flowsheet Row Clinical Support from 06/12/2023 in [facility]  AIMS Total Score 0      GAD-7    Flowsheet Row Clinical Support from 06/12/2023 in [facility] Clinical Support from 07/10/2022 in [facility] Office Visit from 03/21/2022 in [facility]  Total GAD-7 Score 12 11 8       PHQ2-9    Flowsheet Row Clinical Support from 06/12/2023 in [facility] Clinical Support from 07/10/2022 in [facility] Office Visit from 03/21/2022 in [facility]  PHQ-2 Total Score 5 3 3   PHQ-9 Total Score 19 14 15       Flowsheet Row Clinical Support from 06/12/2023 in [facility] UC from 03/30/2023 in [facility] ED from 03/19/2023 in [facility] Emergency Department at [facility]  C-SSRS RISK CATEGORY No Risk No Risk No Risk        Assessment and Plan:   Salbador Fiveash is a 58 year old male with a past psychiatric history significant for anxiety, PTSD, and schizoaffective disorder (bipolar type) who presents to [facility] to reestablish psychiatric care for medication management.  Patient was last seen by Dr. Melisa Spray on 07/10/2022.  During his last encounter, patient was being managed on the following psychiatric medications:  Risperidone  ER (Uzedy ) 50 mg/0.14 mL SUSY Hydroxyzine  10 mg 3 times daily as needed  Patient presents to the encounter stating that he is not currently on any psychiatric medications at this time.  Patient requested be placed back on his psychiatric medications.  Per chart review, patient was supposed to present to this facility to receive his use Uzedy  injection, but patient no-showed.  An aims assessment was performed with the patient scored a 0.  Since his last encounter with Dr. Melisa Spray, patient reports that he has been experiencing some anxiety.  He denies experiencing panic attacks and further denies experiencing episodes of depression.  A PHQ-9 screen was performed with the patient scoring a 19.  A GAD-7 screen was also performed with the patient scoring a 12.  Patient denies paranoia, delusions, or auditory or visual hallucinations  Provider recommended patient be placed on risperidone  1 mg at bedtime.  Provider informed patient that he would transition to risperidone  ER (Uzedy ) 50 mg/0.14 mL SUSY every 28 days.  Patient  verbalized understanding.  Patient has an appointment scheduled with [facility] on 06/26/2023.  Patient vocalized understanding.  Patient was also recommended hydroxyzine  10 mg 3 times daily as needed for the management of his anxiety.  Patient was agreeable to recommendation.  Patient's medications to be e-prescribed to pharmacy of choice.  Patient's blood pressure was obtained.  His first blood pressure reading was found to  be 184/99 mmHg. He had a second blood pressure reading of 185/107 mmHg.  Patient's blood pressure was checked manually and EMS was contacted with patient being sent to the ED.  Patient to be referred to a primary care provider following the conclusion of the encounter.  Patient has a history of taking clonidine  for the management of his blood pressure.  Patient to be given a 30-day supply of clonidine  0.2 mg 3 times daily for the management of his blood pressure.  Patient denies suicidal ideations and is able to contract for safety following the conclusion of the encounter.  Collaboration of Care: Collaboration of Care: Medication Management AEB Provider managing patient's psychiatric medications, Primary Care Provider AEB Patient was referred to a primary care provider, and Psychiatrist AEB patient being followed by a mental health provider at this facility.  Patient/Guardian was advised Release of Information must be obtained prior to any record release in order to collaborate their care with an outside provider. Patient/Guardian was advised if they have not already done so to contact the registration department to sign all necessary forms in order for us  to release information regarding their care.   Consent: Patient/Guardian gives verbal consent for treatment and assignment of benefits for services provided during this visit. Patient/Guardian expressed understanding and agreed to proceed.   1. Primary hypertension  - cloNIDine  (CATAPRES ) 0.1  MG tablet; Take 2 tablets (0.2 mg total) by mouth 2 (two) times daily.  Dispense: 120 tablet; Refill: 0  2. PTSD (post-traumatic stress disorder)  - cloNIDine  (CATAPRES ) 0.1 MG tablet; Take 2 tablets (0.2 mg total) by mouth 2 (two) times daily.  Dispense: 120 tablet; Refill: 0  3. Anxiety state  - hydrOXYzine  (ATARAX ) 10 MG tablet; Take 1 tablet (10 mg total) by mouth 3 (three) times daily as needed.  Dispense: 90 tablet; Refill: 3  4. Does not have primary care provider (Primary)  - Ambulatory referral to Internal Medicine  5. Schizoaffective disorder, bipolar type (HCC)  - risperiDONE  ER (UZEDY ) 50 MG/0.14ML SUSY; Inject 0.14 mLs (50 mg total) into the skin every 28 (twenty-eight) days.  Dispense: 1.4 mL; Refill: 11 - risperiDONE  (RISPERDAL ) 1 MG tablet; Take 1 tablet (1 mg total) by mouth at bedtime.  Dispense: 30 tablet; Refill: 0  EMS was contacted due to his elevated blood pressure and he was transferred to the ED Patient to follow up on July 10th @ 10:30 AM with Dr. Camillo Celestine Provider spent a total of 42 minutes with the patient/reviewing the patient's chart  Gates Kasal, PA 06/12/2023, 9:08 AM

## 2023-06-12 NOTE — ED Provider Notes (Signed)
 [place] [facility] Provider Note   CSN: [id number] Arrival date & time: 06/12/23  7829     History  Chief Complaint  Patient presents with   Chest Pain   Shortness of Breath   Headache    Philip Roth is a 58 y.o. male.  HPI 58 year old male presents with 2 days of chest pain and headache.  Symptoms come and go.  His symptoms were described as exertional in triage but he tells me that his symptoms actually are worst and onset when he sits down.  When he walks he does not notice any symptoms.  Chest pain is diffuse and comes and goes.  Same with the headache.  Neither is significantly prominent right now when I am seeing him.  No shortness of breath, pleuritic pain, radiation of pain to his back or abdomen.  No focal weakness or numbness or vision changes.  He has a history of hypertension but has not been on his clonidine  or Cardizem  for over a month.  Home Medications Prior to Admission medications   Medication Sig Start Date End Date Taking? Authorizing Provider  aspirin  81 MG chewable tablet Chew 1 tablet (81 mg total) by mouth daily. 01/16/22   Wedderburn, Ngozi N, NP  atorvastatin  (LIPITOR) 20 MG tablet Take 1 tablet (20 mg total) by mouth daily. 03/30/23   Ward, Char Common, PA-C  cloNIDine  (CATAPRES ) 0.1 MG tablet Take 2 tablets (0.2 mg total) by mouth 2 (two) times daily. 06/12/23   Nwoko, Uchenna E, PA  diltiazem  (CARDIZEM  CD) 120 MG 24 hr capsule Take 1 capsule (120 mg total) by mouth daily. 06/12/23   Jerilynn Montenegro, MD  hydrOXYzine  (ATARAX ) 10 MG tablet Take 1 tablet (10 mg total) by mouth 3 (three) times daily as needed. 06/12/23   Nwoko, Uchenna E, PA  risperiDONE  (RISPERDAL ) 1 MG tablet Take 1 tablet (1 mg total) by mouth at bedtime. 06/12/23   Nwoko, Uchenna E, PA  risperiDONE  ER (UZEDY ) 50 MG/0.14ML SUSY Inject 0.14 mLs (50 mg total) into the skin every 28 (twenty-eight) days. 06/12/23   Nwoko, Uchenna E, PA      Allergies    Lactose  intolerance (gi)    Review of Systems   Review of Systems  Constitutional:  Negative for fever.  Respiratory:  Negative for shortness of breath.   Cardiovascular:  Positive for chest pain.  Gastrointestinal:  Negative for abdominal pain.  Musculoskeletal:  Negative for back pain.  Neurological:  Positive for headaches. Negative for weakness and numbness.    Physical Exam Updated Vital Signs BP (!) 176/85   Pulse 67   Temp 98.1 F (36.7 C) (Oral)   Resp (!) 22   Ht 5\' 8"  (1.727 m)   Wt 72.6 kg   SpO2 97%   BMI 24.33 kg/m  Physical Exam Vitals and nursing note reviewed.  Constitutional:      General: He is not in acute distress.    Appearance: He is well-developed. He is not ill-appearing or diaphoretic.  HENT:     Head: Normocephalic and atraumatic.  Eyes:     Extraocular Movements: Extraocular movements intact.     Pupils: Pupils are equal, round, and reactive to light.  Cardiovascular:     Rate and Rhythm: Normal rate and regular rhythm.     Heart sounds: Normal heart sounds.  Pulmonary:     Effort: Pulmonary effort is normal.     Breath sounds: Normal breath sounds.  Abdominal:  Palpations: Abdomen is soft.     Tenderness: There is no abdominal tenderness.  Skin:    General: Skin is warm and dry.  Neurological:     Mental Status: He is alert.     Comments: CN 3-12 grossly intact. 5/5 strength in all 4 extremities. Grossly normal sensation. Normal finger to nose.      ED Results / Procedures / Treatments   Labs (all labs ordered are listed, but only abnormal results are displayed) Labs Reviewed  CBC  COMPREHENSIVE METABOLIC PANEL WITH GFR  TROPONIN I (HIGH SENSITIVITY)  TROPONIN I (HIGH SENSITIVITY)    EKG EKG Interpretation Date/Time:  Wednesday Jun 12 2023 09:50:57 EDT Ventricular Rate:  65 PR Interval:  160 QRS Duration:  86 QT Interval:  422 QTC Calculation: 438 R Axis:   21  Text Interpretation: Normal sinus rhythm with sinus arrhythmia  Moderate voltage criteria for LVH, may be normal variant ( R in aVL , Sokolow-Lyon ) Nonspecific T wave abnormality Abnormal ECG When compared with ECG of 19-Mar-2023 02:33, PREVIOUS ECG IS PRESENT Confirmed by Auston Blush [phone]) on 06/12/2023 10:04:21 AM  Radiology CT Head Wo Contrast Result Date: 06/12/2023 CLINICAL DATA:  New onset headache EXAM: CT HEAD WITHOUT CONTRAST TECHNIQUE: Contiguous axial images were obtained from the base of the skull through the vertex without intravenous contrast. RADIATION DOSE REDUCTION: This exam was performed according to the departmental dose-optimization program which includes automated exposure control, adjustment of the mA and/or kV according to patient size and/or use of iterative reconstruction technique. COMPARISON:  01/10/2022 FINDINGS: Brain: No mass,hemorrhage or extra-axial collection. Normal appearance of the parenchyma and CSF spaces. Vascular: No hyperdense vessel or unexpected vascular calcification. Skull: The visualized skull base, calvarium and extracranial soft tissues are normal. Sinuses/Orbits: No fluid levels or advanced mucosal thickening of the visualized paranasal sinuses. No mastoid or middle ear effusion. Normal orbits. Other: None. IMPRESSION: Normal head CT. Electronically Signed   By: Juanetta Nordmann M.D.   On: 06/12/2023 12:28   DG Chest 2 View Result Date: 06/12/2023 CLINICAL DATA:  Chest pain. EXAM: CHEST - 2 VIEW COMPARISON:  January 10, 2022. FINDINGS: The heart size and mediastinal contours are within normal limits. Both lungs are clear. The visualized skeletal structures are unremarkable. IMPRESSION: No active cardiopulmonary disease. Electronically Signed   By: Rosalene Colon M.D.   On: 06/12/2023 11:36    Procedures Procedures    Medications Ordered in ED Medications  cloNIDine  (CATAPRES ) tablet 0.2 mg (has no administration in time range)  diltiazem  (CARDIZEM  CD) 24 hr capsule 120 mg (has no administration in time range)     ED Course/ Medical Decision Making/ A&P                                 Medical Decision Making Amount and/or Complexity of Data Reviewed External Data Reviewed: notes. Labs: ordered.    Details: Normal troponin x 2 Radiology: ordered and independent interpretation performed.    Details: No head bleed or CHF ECG/medicine tests: ordered and independent interpretation performed.    Details: Unchanged from baseline  Risk Prescription drug management.   Patient is well-appearing and essentially asymptomatic at this time.  He does have significantly elevated blood pressure but I suspect this is from not taking his meds for over a month.  Given he is otherwise asymptomatic at this moment, I highly doubt hypertensive emergency.  Troponins are negative x  2.  ECG is reassuring.  No radiation of his pain to the back or pleuritic pain to suggest PE or dissection.  Doubt ACS.  CT head was ordered in triage but is negative and he has no focal neurosigns or symptoms.  Very low suspicion for infection such as meningitis or encephalitis.  At this point, we will refill his Cardizem  and given the dose here as well as a dose of his clonidine .  His clonidine  was already refilled earlier this morning.  Will discharge home with return precautions.        Final Clinical Impression(s) / ED Diagnoses Final diagnoses:  Uncontrolled hypertension  Nonspecific chest pain    Rx / DC Orders ED Discharge Orders          Ordered    diltiazem  (CARDIZEM  CD) 120 MG 24 hr capsule  Daily        06/12/23 1433              Jerilynn Montenegro, MD 06/12/23 1448

## 2023-06-12 NOTE — ED Notes (Signed)
 Patients ride home called to meet him in the lobby.

## 2023-06-26 ENCOUNTER — Ambulatory Visit (HOSPITAL_COMMUNITY): Payer: MEDICAID

## 2023-07-09 ENCOUNTER — Other Ambulatory Visit (HOSPITAL_COMMUNITY): Payer: Self-pay | Admitting: Physician Assistant

## 2023-07-09 DIAGNOSIS — F250 Schizoaffective disorder, bipolar type: Secondary | ICD-10-CM

## 2023-07-31 NOTE — Progress Notes (Deleted)
 [facility] MD Progress Note  07/31/2023 10:51 AM Philip Roth  MRN:  [id number]   #Schizoaffective disorder, bipolar type (HCC) - Risperdal  1 mg at bedtime A1c: 4.7 Lipid panel: chol 239, LDL 176 EKG: Qtc is 438 on 05/2023 Labs updated on 03/2021 - Will need updated labs - Was supposed to receive Risperdal  Uzedy  [facility] on 6/4 but did not show up to the appt  #PTSD (post-traumatic stress disorder) - Clonidine  0.2 mg tablet BID  #Anxiety state - Hydroxyzine  10 MG tablet TID PRN   Chief Complaint:  No chief complaint on file.  Subjective: Philip Roth is a 58 year old male with a past psychiatric history significant for anxiety, PTSD, and schizoaffective disorder (bipolar type) who presents to [facility].  Patient seen ***.  Patient reports feeling *** today. Since the previous visit, ***. Regarding psychiatric symptoms, ***. Regarding medications, patient notes ***. Patient reports the following adverse effects: ***. Patient reports *** sleep, ***. Patient reports *** appetite, ***. Patient rates anxiety a ***/10, depression a ***/10, and anger a ***/10. Patient denies current SI, HI, and AVH. ***   Life updates: *** Stressors include ***.   Substance use: ***   Visit Diagnosis:  No diagnosis found.   Past Psychiatric History:  Patient has a documented psychiatric history of PTSD, anxiety, and Schizoaffective disorder Previous Rx: ***  Hospitalizations: *** Suicide attempts: *** Access to guns: ***  Past Medical History:  Past Medical History:  Diagnosis Date   Hypertension    Polysubstance abuse (HCC)    Tobacco abuse    No past surgical history on file.  Family Psychiatric History:  PTSD, Bipolar depression, alcohol use, cocaine use (in remission), hospitalized in the 90s [place] 4 to 5 years [facility] [place]   Family History:  Family History  Problem Relation Age of Onset   Stroke Mother     Social  History:  Living: *** Occupation: *** Relationship: *** Children: *** Support: *** Legal History: ***   Social History   Socioeconomic History   Marital status: Single    Spouse name: Not on file   Number of children: Not on file   Years of education: Not on file   Highest education level: Not on file  Occupational History   Not on file  Tobacco Use   Smoking status: Every Day    Types: Cigarettes   Smokeless tobacco: Never  Vaping Use   Vaping status: Every Day  Substance and Sexual Activity   Alcohol use: Not Currently   Drug use: Not Currently    Types: Marijuana   Sexual activity: Not on file  Other Topics Concern   Not on file  Social History Narrative   Not on file   Social Drivers of Health   Financial Resource Strain: Not on file  Food Insecurity: Not on file  Transportation Needs: Not on file  Physical Activity: Not on file  Stress: Not on file  Social Connections: Not on file    Allergies:  Allergies  Allergen Reactions   Lactose Intolerance (Gi) Diarrhea    Metabolic Disorder Labs: Lab Results  Component Value Date   HGBA1C 4.7 (L) 04/16/2021   MPG 88.19 04/16/2021   No results found for: PROLACTIN Lab Results  Component Value Date   CHOL 239 (H) 04/16/2021   TRIG 56 04/16/2021   HDL 52 04/16/2021   CHOLHDL 4.6 04/16/2021   VLDL 11 04/16/2021   LDLCALC 176 (H) 04/16/2021  Lab Results  Component Value Date   TSH 1.116 04/16/2021    Therapeutic Level Labs: No results found for: LITHIUM No results found for: VALPROATE No results found for: CBMZ  Current Medications: Current Outpatient Medications  Medication Sig Dispense Refill   aspirin  81 MG chewable tablet Chew 1 tablet (81 mg total) by mouth daily. 30 tablet 2   atorvastatin  (LIPITOR) 20 MG tablet Take 1 tablet (20 mg total) by mouth daily. 30 tablet 2   cloNIDine  (CATAPRES ) 0.1 MG tablet Take 2 tablets (0.2 mg total) by mouth 2 (two) times daily. 120 tablet 0    diltiazem  (CARDIZEM  CD) 120 MG 24 hr capsule Take 1 capsule (120 mg total) by mouth daily. 30 capsule 1   hydrOXYzine  (ATARAX ) 10 MG tablet Take 1 tablet (10 mg total) by mouth 3 (three) times daily as needed. 90 tablet 3   risperiDONE  (RISPERDAL ) 1 MG tablet TAKE 1 TABLET BY MOUTH AT BEDTIME. 30 tablet 0   risperiDONE  ER (UZEDY ) 50 MG/0.14ML SUSY Inject 0.14 mLs (50 mg total) into the skin every 28 (twenty-eight) days. 1.4 mL 11   No current facility-administered medications for this visit.     Musculoskeletal: Strength & Muscle Tone: within normal limits Gait & Station: normal Patient leans: N/A  Psychiatric Specialty Exam  General Appearance: appears at stated age, casually dressed and groomed ***  Behavior: pleasant and cooperative ***  Psychomotor Activity: no psychomotor agitation or retardation noted ***  Eye Contact: fair *** Speech: normal amount, volume and fluency ***   Mood: euthymic *** Affect: congruent, pleasant and interactive ***  Thought Process: linear, goal directed, no circumstantial or tangential thought process noted, no racing thoughts or flight of ideas *** Descriptions of Associations: intact ***  Thought Content Hallucinations: denies AH, VH , does not appear responding to stimuli *** Delusions: no paranoia, delusions of control, grandeur, ideas of reference, thought broadcasting, and magical thinking *** Suicidal Thoughts: denies SI, intention, plan *** Homicidal Thoughts: denies HI, intention, plan ***  Alertness/Orientation: alert and fully oriented ***  Insight: fair*** Judgment: fair***  Memory: intact ***  Executive Functions  Concentration: intact *** Attention Span: fair *** Recall: intact *** Fund of Knowledge: fair ***  Physical Exam *** General: Pleasant, well-appearing ***. No acute distress. Pulmonary: Normal effort. No wheezing or rales. Skin: No obvious rash or lesions. Neuro: A&Ox3.No focal deficit.   Review of  Systems *** No reported symptoms   Screenings: AIMS    Flowsheet Row Clinical Support from 06/12/2023 in [facility]  AIMS Total Score 0   GAD-7    Flowsheet Row Clinical Support from 06/12/2023 in [facility] Clinical Support from 07/10/2022 in [facility] Office Visit from 03/21/2022 in [facility]  Total GAD-7 Score 12 11 8    PHQ2-9    Flowsheet Row Clinical Support from 06/12/2023 in [facility] Clinical Support from 07/10/2022 in [facility] Office Visit from 03/21/2022 in [facility]  PHQ-2 Total Score 5 3 3   PHQ-9 Total Score 19 14 15    Flowsheet Row ED from 06/12/2023 in [facility] at [facility] Most recent reading at 06/12/2023  9:56 AM Clinical Support from 06/12/2023 in [facility] Most recent reading at 06/12/2023  8:32 AM UC from 03/30/2023 in [facility] [place] Most recent reading at 03/30/2023  1:34 PM  C-SSRS RISK CATEGORY No Risk No Risk No Risk    Ismael Franco, MD PGY-3 Psychiatry Resident

## 2023-08-01 ENCOUNTER — Encounter (HOSPITAL_COMMUNITY): Payer: Self-pay

## 2023-08-01 ENCOUNTER — Encounter (HOSPITAL_COMMUNITY): Payer: MEDICAID | Admitting: Psychiatry

## 2023-08-07 ENCOUNTER — Telehealth (HOSPITAL_COMMUNITY): Payer: Self-pay

## 2023-08-07 NOTE — Telephone Encounter (Signed)
 Called this patient to get him scheduled in [facility] clinic. Someone picked up the call stated patient was not there I asked if he could have him give us  a call. Thank you.

## 2023-09-24 ENCOUNTER — Emergency Department (HOSPITAL_COMMUNITY)
Admission: EM | Admit: 2023-09-24 | Discharge: 2023-09-24 | Disposition: A | Discharge status: Home / Self Care | Payer: MEDICAID | Attending: Emergency Medicine | Admitting: Emergency Medicine

## 2023-09-24 ENCOUNTER — Emergency Department (HOSPITAL_COMMUNITY): Payer: MEDICAID

## 2023-09-24 ENCOUNTER — Other Ambulatory Visit: Payer: Self-pay

## 2023-09-24 ENCOUNTER — Encounter (HOSPITAL_COMMUNITY): Payer: Self-pay

## 2023-09-24 DIAGNOSIS — Z7982 Long term (current) use of aspirin: Secondary | ICD-10-CM | POA: Insufficient documentation

## 2023-09-24 DIAGNOSIS — I10 Essential (primary) hypertension: Secondary | ICD-10-CM | POA: Insufficient documentation

## 2023-09-24 DIAGNOSIS — Z720 Tobacco use: Secondary | ICD-10-CM | POA: Insufficient documentation

## 2023-09-24 DIAGNOSIS — Z91148 Patient's other noncompliance with medication regimen for other reason: Secondary | ICD-10-CM | POA: Insufficient documentation

## 2023-09-24 DIAGNOSIS — R0789 Other chest pain: Secondary | ICD-10-CM | POA: Insufficient documentation

## 2023-09-24 DIAGNOSIS — R079 Chest pain, unspecified: Secondary | ICD-10-CM

## 2023-09-24 DIAGNOSIS — F4310 Post-traumatic stress disorder, unspecified: Secondary | ICD-10-CM

## 2023-09-24 LAB — CBC
HCT: 43.9 % (ref 39.0–52.0)
Hemoglobin: 14.2 g/dL (ref 13.0–17.0)
MCH: 29.7 pg (ref 26.0–34.0)
MCHC: 32.3 g/dL (ref 30.0–36.0)
MCV: 91.8 fL (ref 80.0–100.0)
Platelets: 292 K/uL (ref 150–400)
RBC: 4.78 MIL/uL (ref 4.22–5.81)
RDW: 12.7 % (ref 11.5–15.5)
WBC: 6.6 K/uL (ref 4.0–10.5)
nRBC: 0 % (ref 0.0–0.2)

## 2023-09-24 LAB — BASIC METABOLIC PANEL WITH GFR
Anion gap: 13 (ref 5–15)
BUN: 9 mg/dL (ref 6–20)
CO2: 24 mmol/L (ref 22–32)
Calcium: 9.4 mg/dL (ref 8.9–10.3)
Chloride: 102 mmol/L (ref 98–111)
Creatinine, Ser: 1.15 mg/dL (ref 0.61–1.24)
GFR, Estimated: >60 mL/min (ref >60)
Glucose, Bld: 91 mg/dL (ref 70–99)
Potassium: 3.7 mmol/L (ref 3.5–5.1)
Sodium: 139 mmol/L (ref 135–145)

## 2023-09-24 LAB — TROPONIN I (HIGH SENSITIVITY)
Troponin I (High Sensitivity): 8 ng/L (ref <18)
Troponin I (High Sensitivity): 7 ng/L (ref <18)

## 2023-09-24 MED ORDER — DILTIAZEM HCL ER COATED BEADS 120 MG PO CP24: 120.0000 mg | ORAL_CAPSULE | Freq: Every day | ORAL | 1 refills | Status: AC

## 2023-09-24 MED ORDER — CLONIDINE HCL 0.1 MG PO TABS: 0.2000 mg | ORAL_TABLET | Freq: Two times a day (BID) | ORAL | 0 refills | Status: AC

## 2023-09-24 MED ORDER — DILTIAZEM HCL ER COATED BEADS 120 MG PO CP24
120.0000 mg | ORAL_CAPSULE | Freq: Once | ORAL | Status: AC
  Administered 2023-09-24: 120 mg via ORAL
  Filled 2023-09-24 (×2): qty 1

## 2023-09-24 NOTE — ED Notes (Addendum)
 Walked into to room. Patient was eating chips Patient reports some shortness of breath with a headache. States he has not taken his clonidine  for 3 weeks, because he is out of it. Also states the was going to see his PCP but began having chest pain and decided to come here.

## 2023-09-24 NOTE — ED Notes (Signed)
 Vea,NT checked with security and was unable to find pt phone. He will speak with them upon discharge

## 2023-09-24 NOTE — Discharge Instructions (Signed)
 FOLLOW UP WITH A PRIMARY CARE DOCTOR.  I have given you the information for one. You can also call your insurance ([employer]) and ask customer service for a list of primary care providers covered in this area.

## 2023-09-24 NOTE — ED Notes (Signed)
 Pharmacy Stephane states cardizem  was sent. This paramedic went to all 3 tube stations and medication is not here. She will re-send.

## 2023-09-24 NOTE — ED Notes (Signed)
 Pt states he left his cell phone on the charging station in the waiting room. This paramedic walked out there and there is no cell phone on either station. [employer] in lobby states they did see It earlier and told security it was there. Security called but there was no answer. Will continue to call.

## 2023-09-24 NOTE — ED Provider Notes (Signed)
 [place] EMERGENCY DEPARTMENT AT [facility] Provider Note   CSN: [id number] Arrival date & time: 09/24/23  9448     Patient presents with: Hypertension   Philip Roth is a 58 y.o. male.    Hypertension  Patient is a 58 year old male with past medical history significant for hypertension, polysubstance abuse, tobacco use  Who presents emergency room today with complaints of some chest discomfort intermittently over the past 2 weeks he has no chest pain currently.  He states last time he had chest pain was 2 days ago he states it was bilateral lower ribs with no radiation to back or neck, not associated with nausea or vomiting or shortness of breath.  He states he came to the emergency room today because of feeling somewhat lightheaded for the past 2 to 3 days which prompted him to check his blood pressure which was elevated.  He states he has not taken his blood pressure medications in approximately 2 weeks.  He has not had any episodes of syncope and does not feel that he has nearly syncopized either. He specifically does not have any exertional or pleuritic chest pain.  He states it does occur sometimes when he is reclining.     Prior to Admission medications   Medication Sig Start Date End Date Taking? Authorizing Provider  aspirin  81 MG chewable tablet Chew 1 tablet (81 mg total) by mouth daily. 01/16/22   Wedderburn, Ngozi N, NP  atorvastatin  (LIPITOR) 20 MG tablet Take 1 tablet (20 mg total) by mouth daily. 03/30/23   Ward, Harlene PEDLAR, PA-C  cloNIDine  (CATAPRES ) 0.1 MG tablet Take 2 tablets (0.2 mg total) by mouth 2 (two) times daily. 09/24/23   Neldon Hamp RAMAN, PA  diltiazem  (CARDIZEM  CD) 120 MG 24 hr capsule Take 1 capsule (120 mg total) by mouth daily. 09/24/23   Neldon Hamp RAMAN, PA  hydrOXYzine  (ATARAX ) 10 MG tablet Take 1 tablet (10 mg total) by mouth 3 (three) times daily as needed. 06/12/23   Nwoko, Uchenna E, PA  risperiDONE  (RISPERDAL ) 1 MG tablet TAKE 1 TABLET  BY MOUTH AT BEDTIME. 07/10/23   Nwoko, Uchenna E, PA  risperiDONE  ER (UZEDY ) 50 MG/0.14ML SUSY Inject 0.14 mLs (50 mg total) into the skin every 28 (twenty-eight) days. 06/12/23   Nwoko, Uchenna E, PA    Allergies: Lactose intolerance (gi)    Review of Systems  Updated Vital Signs BP (!) 159/78   Pulse 73   Temp 98 F (36.7 C)   Resp 20   Ht 5' 8 (1.727 m)   Wt 73.9 kg   SpO2 99%   BMI 24.78 kg/m   Physical Exam Vitals and nursing note reviewed.  Constitutional:      General: He is not in acute distress.    Comments: Pleasant well-appearing 58 year old.  In no acute distress.  Sitting comfortably in bed.  Able answer questions appropriately follow commands. No increased work of breathing. Speaking in full sentences.   HENT:     Head: Normocephalic and atraumatic.     Nose: Nose normal.  Eyes:     General: No scleral icterus. Cardiovascular:     Rate and Rhythm: Normal rate and regular rhythm.     Pulses: Normal pulses.     Heart sounds: Normal heart sounds.  Pulmonary:     Effort: Pulmonary effort is normal. No respiratory distress.     Breath sounds: No wheezing.  Abdominal:     Palpations: Abdomen is soft.  Tenderness: There is no abdominal tenderness.  Musculoskeletal:     Cervical back: Normal range of motion.     Right lower leg: No edema.     Left lower leg: No edema.  Skin:    General: Skin is warm and dry.     Capillary Refill: Capillary refill takes less than 2 seconds.  Neurological:     Mental Status: He is alert. Mental status is at baseline.  Psychiatric:        Mood and Affect: Mood normal.        Behavior: Behavior normal.     (all labs ordered are listed, but only abnormal results are displayed) Labs Reviewed  BASIC METABOLIC PANEL WITH GFR  CBC  TROPONIN I (HIGH SENSITIVITY)  TROPONIN I (HIGH SENSITIVITY)    EKG: None  Radiology: DG Chest 2 View Result Date: 09/24/2023 CLINICAL DATA:  Hypertension. EXAM: CHEST - 2 VIEW COMPARISON:   06/12/2023 FINDINGS: The heart size and mediastinal contours are within normal limits. Both lungs are clear. The visualized skeletal structures are unremarkable. IMPRESSION: No active cardiopulmonary disease. Electronically Signed   By: Camellia Candle M.D.   On: 09/24/2023 06:46     Procedures   Medications Ordered in the ED  diltiazem  (CARDIZEM  CD) 24 hr capsule 120 mg (has no administration in time range)    Clinical Course as of 09/24/23 0801  Tue Sep 24, 2023  0703 CP intermitently for 2 weeks. Last CP 2 days ago. Bilateral lower mid chest.  No dyspnea or nausea or vomiting.   [WF]    Clinical Course User Index [WF] Neldon Hamp RAMAN, PA                                 Medical Decision Making Amount and/or Complexity of Data Reviewed Labs: ordered. Radiology: ordered.  Risk Prescription drug management.   This patient presents to the ED for concern of CP, this involves a number of treatment options, and is a complaint that carries with it a moderate to high risk of complications and morbidity. A differential diagnosis was considered for the patient's symptoms which is discussed below:   The emergent causes of chest pain include: Acute coronary syndrome, tamponade, pericarditis/myocarditis, aortic dissection, pulmonary embolism, tension pneumothorax, pneumonia, and esophageal rupture.   I do not believe the patient has an emergent cause of chest pain, other urgent/non-acute considerations include, but are not limited to: chronic angina, aortic stenosis, cardiomyopathy, mitral valve prolapse, pulmonary hypertension, aortic insufficiency, right ventricular hypertrophy, pleuritis, bronchitis, pneumothorax, tumor, gastroesophageal reflux disease (GERD), esophageal spasm, Mallory-Weiss syndrome, peptic ulcer disease, pancreatitis, functional gastrointestinal pain, cervical or thoracic disk disease or arthritis, shoulder arthritis, costochondritis, subacromial bursitis, anxiety or panic  attack, herpes zoster, breast disorders, chest wall tumors, thoracic outlet syndrome, mediastinitis.    Co morbidities: Discussed in HPI   Brief History:  Patient is a 58 year old male with past medical history significant for hypertension, polysubstance abuse, tobacco use  Who presents emergency room today with complaints of some chest discomfort intermittently over the past 2 weeks he has no chest pain currently.  He states last time he had chest pain was 2 days ago he states it was bilateral lower ribs with no radiation to back or neck, not associated with nausea or vomiting or shortness of breath.  He states he came to the emergency room today because of feeling somewhat lightheaded for the past 2 to  3 days which prompted him to check his blood pressure which was elevated.  He states he has not taken his blood pressure medications in approximately 2 weeks.  He has not had any episodes of syncope and does not feel that he has nearly syncopized either. He specifically does not have any exertional or pleuritic chest pain.  He states it does occur sometimes when he is reclining.    EMR reviewed including pt PMHx, past surgical history and past visits to ER.   See HPI for more details   Lab Tests:  I personally reviewed all laboratory work and imaging. Metabolic panel without any acute abnormality specifically kidney function within normal limits and no significant electrolyte abnormalities. CBC without leukocytosis or significant anemia. Trop x2 nml  Imaging Studies:  NAD. I personally reviewed all imaging studies and no acute abnormality found. I agree with radiology interpretation.    Cardiac Monitoring:  The patient was maintained on a cardiac monitor.  I personally viewed and interpreted the cardiac monitored which showed an underlying rhythm of: NSR EKG non-ischemic inferior lateral T wave inversions reviewed prior EKG which shows similar TWI /T wave  flattening   Medicines ordered:  I ordered medication including diltiezem 24 hour for HTN Reevaluation of the patient after these medicines showed that the patient improved I have reviewed the patients home medicines and have made adjustments as needed   Critical Interventions:     Consults/Attending Physician      Reevaluation:  After the interventions noted above I re-evaluated patient and found that they have :improved   Social Determinants of Health:      Problem List / ED Course:  Patient with no current chest pain but has had some episodes of chest pain here with mainly concerned about hypertension.  I provided him his morning diltiazem  which he has not taken in months and monitor his blood pressure which improved.  He is not having any headaches troponin normal no pulmonary edema on chest x-ray and no dyspnea or crackles on exam.  Overall well-appearing will discharge home with outpatient follow-up.  Return precautions discussed, I have refilled his medications.   Dispostion:  After consideration of the diagnostic results and the patients response to treatment, I feel that the patent would benefit from discharge home with outpatient follow-up.   Final diagnoses:  Chest pain, unspecified type    ED Discharge Orders          Ordered    diltiazem  (CARDIZEM  CD) 120 MG 24 hr capsule  Daily        09/24/23 0708    cloNIDine  (CATAPRES ) 0.1 MG tablet  2 times daily       Note to Pharmacy: Please cancel 73 qty of this and use the 120 qty rx   09/24/23 0708               Neldon Hamp RAMAN, PA 09/24/23 1521    Mannie Pac T, DO 09/27/23 0725

## 2023-09-24 NOTE — ED Triage Notes (Addendum)
 Pt arrived via POV c/o feeling swimmy headed x 2 or 3 days. Pt requested a bp check Pt noted to be very hypertensive. Pt states that he has a hx htn and is supposed to take medication for it. Pt states that he has been out of his medication for a few weeks. NIH 0

## 2023-11-08 ENCOUNTER — Other Ambulatory Visit: Payer: Self-pay

## 2023-11-08 ENCOUNTER — Emergency Department (HOSPITAL_COMMUNITY)
Admission: EM | Admit: 2023-11-08 | Discharge: 2023-11-08 | Disposition: A | Discharge status: Home / Self Care | Payer: MEDICAID | Attending: Emergency Medicine | Admitting: Emergency Medicine

## 2023-11-08 DIAGNOSIS — F17210 Nicotine dependence, cigarettes, uncomplicated: Secondary | ICD-10-CM | POA: Insufficient documentation

## 2023-11-08 DIAGNOSIS — I10 Essential (primary) hypertension: Secondary | ICD-10-CM | POA: Insufficient documentation

## 2023-11-08 DIAGNOSIS — Z79899 Other long term (current) drug therapy: Secondary | ICD-10-CM | POA: Diagnosis not present

## 2023-11-08 DIAGNOSIS — R739 Hyperglycemia, unspecified: Secondary | ICD-10-CM | POA: Diagnosis not present

## 2023-11-08 DIAGNOSIS — Z7982 Long term (current) use of aspirin: Secondary | ICD-10-CM | POA: Insufficient documentation

## 2023-11-08 DIAGNOSIS — T402X1A Poisoning by other opioids, accidental (unintentional), initial encounter: Secondary | ICD-10-CM | POA: Insufficient documentation

## 2023-11-08 DIAGNOSIS — R464 Slowness and poor responsiveness: Secondary | ICD-10-CM | POA: Diagnosis present

## 2023-11-08 DIAGNOSIS — T40601A Poisoning by unspecified narcotics, accidental (unintentional), initial encounter: Secondary | ICD-10-CM

## 2023-11-08 LAB — CBC WITH DIFFERENTIAL/PLATELET
Abs Immature Granulocytes: 0.08 K/uL — ABNORMAL HIGH (ref 0.00–0.07)
Basophils Absolute: 0.1 K/uL (ref 0.0–0.1)
Basophils Relative: 1 %
Eosinophils Absolute: 0.3 K/uL (ref 0.0–0.5)
Eosinophils Relative: 3 %
HCT: 44.9 % (ref 39.0–52.0)
Hemoglobin: 14.6 g/dL (ref 13.0–17.0)
Immature Granulocytes: 1 %
Lymphocytes Relative: 30 %
Lymphs Abs: 2.8 K/uL (ref 0.7–4.0)
MCH: 29.3 pg (ref 26.0–34.0)
MCHC: 32.5 g/dL (ref 30.0–36.0)
MCV: 90 fL (ref 80.0–100.0)
Monocytes Absolute: 0.5 K/uL (ref 0.1–1.0)
Monocytes Relative: 5 %
Neutro Abs: 5.7 K/uL (ref 1.7–7.7)
Neutrophils Relative %: 60 %
Platelets: 279 K/uL (ref 150–400)
RBC: 4.99 MIL/uL (ref 4.22–5.81)
RDW: 12.5 % (ref 11.5–15.5)
WBC: 9.5 K/uL (ref 4.0–10.5)
nRBC: 0 % (ref 0.0–0.2)

## 2023-11-08 LAB — BASIC METABOLIC PANEL WITH GFR
Anion gap: 13 (ref 5–15)
BUN: 10 mg/dL (ref 6–20)
CO2: 20 mmol/L — ABNORMAL LOW (ref 22–32)
Calcium: 8.7 mg/dL — ABNORMAL LOW (ref 8.9–10.3)
Chloride: 105 mmol/L (ref 98–111)
Creatinine, Ser: 1.27 mg/dL — ABNORMAL HIGH (ref 0.61–1.24)
GFR, Estimated: >60 mL/min (ref >60)
Glucose, Bld: 225 mg/dL — ABNORMAL HIGH (ref 70–99)
Potassium: 3.9 mmol/L (ref 3.5–5.1)
Sodium: 138 mmol/L (ref 135–145)

## 2023-11-08 LAB — ACETAMINOPHEN LEVEL: Acetaminophen (Tylenol), Serum: <10 ug/mL — ABNORMAL LOW (ref 10–30)

## 2023-11-08 LAB — SALICYLATE LEVEL: Salicylate Lvl: <7 mg/dL — ABNORMAL LOW (ref 7.0–30.0)

## 2023-11-08 LAB — ETHANOL: Alcohol, Ethyl (B): <15 mg/dL (ref <15)

## 2023-11-08 MED ORDER — NALOXONE HCL 4 MG/0.1ML NA LIQD: 1.0000 | Freq: Once | NASAL | Status: DC

## 2023-11-08 MED ORDER — SODIUM CHLORIDE 0.9 % IV BOLUS
1000.0000 mL | Freq: Once | INTRAVENOUS | Status: AC
  Administered 2023-11-08: 1000 mL via INTRAVENOUS

## 2023-11-08 MED ORDER — NALOXONE HCL 4 MG/0.1ML NA LIQD
NASAL | 0 refills | Status: AC
  Filled 2023-11-08: qty 2, 2d supply, fill #0

## 2023-11-08 MED ORDER — NALOXONE HCL 2 MG/2ML IJ SOSY
2.0000 mg | PREFILLED_SYRINGE | Freq: Once | INTRAMUSCULAR | Status: AC
  Administered 2023-11-08: 2 mg via INTRAVENOUS

## 2023-11-08 NOTE — ED Provider Notes (Signed)
 [place] EMERGENCY DEPARTMENT AT [facility] Provider Note  CSN: [id number] Arrival date & time: 11/08/23 1140  Chief Complaint(s) No chief complaint on file.  HPI Philip Roth is a 58 y.o. male who was found unresponsive in her waiting room bathroom.  Patient has a history of polysubstance use, schizoaffective disorder.   Past Medical History Past Medical History:  Diagnosis Date   Hypertension    Polysubstance abuse (HCC)    Tobacco abuse    Patient Active Problem List   Diagnosis Date Noted   Adjustment disorder with mixed anxiety and depressed mood 03/16/2022   Stable angina    Hypertensive urgency 04/16/2021   Home Medication(s) Prior to Admission medications   Medication Sig Start Date End Date Taking? Authorizing Provider  aspirin  81 MG chewable tablet Chew 1 tablet (81 mg total) by mouth daily. 01/16/22  Yes Wedderburn, Ngozi N, NP  atorvastatin  (LIPITOR) 20 MG tablet Take 1 tablet (20 mg total) by mouth daily. 03/30/23  Yes Ward, Harlene PEDLAR, PA-C  cloNIDine  (CATAPRES ) 0.1 MG tablet Take 2 tablets (0.2 mg total) by mouth 2 (two) times daily. 09/24/23  Yes Fondaw, Wylder S, PA  diltiazem  (CARDIZEM  CD) 120 MG 24 hr capsule Take 1 capsule (120 mg total) by mouth daily. 09/24/23  Yes Fondaw, Hamp RAMAN, PA  naloxone (NARCAN) nasal spray 4 mg/0.1 mL Keep next to you if you are using opiates.  If there is suspicion of overdose, spray into nostril. 11/08/23  Yes Mannie Pac T, DO  risperiDONE  (RISPERDAL ) 1 MG tablet TAKE 1 TABLET BY MOUTH AT BEDTIME. 07/10/23  Yes Nwoko, Uchenna E, PA                                                                                                                                    Past Surgical History No past surgical history on file. Family History Family History  Problem Relation Age of Onset   Stroke Mother     Social History Social History   Tobacco Use   Smoking status: Every Day    Types: Cigarettes   Smokeless  tobacco: Never  Vaping Use   Vaping status: Every Day  Substance Use Topics   Alcohol use: Not Currently   Drug use: Not Currently    Types: Marijuana   Allergies Lactose intolerance (gi)  Review of Systems Review of Systems  Physical Exam Vital Signs  I have reviewed the triage vital signs BP (!) 184/94   Pulse 96   Resp (!) 21   SpO2 100%   Physical Exam Vitals reviewed.  Constitutional:      Appearance: He is ill-appearing. He is not toxic-appearing.  HENT:     Head: Normocephalic and atraumatic.     Mouth/Throat:     Pharynx: No oropharyngeal exudate.  Eyes:     Comments: Pupils 2 mm and equal and reactive  Cardiovascular:     Rate  and Rhythm: Normal rate.  Pulmonary:     Comments: Patient not breathing spontaneously Abdominal:     General: Abdomen is flat.     Palpations: Abdomen is soft.  Musculoskeletal:        General: No swelling or deformity. Normal range of motion.  Neurological:     Comments: Patient not responding to commands     ED Results and Treatments Labs (all labs ordered are listed, but only abnormal results are displayed) Labs Reviewed  CBC WITH DIFFERENTIAL/PLATELET - Abnormal; Notable for the following components:      Result Value   Abs Immature Granulocytes 0.08 (*)    All other components within normal limits  BASIC METABOLIC PANEL WITH GFR - Abnormal; Notable for the following components:   CO2 20 (*)    Glucose, Bld 225 (*)    Creatinine, Ser 1.27 (*)    Calcium  8.7 (*)    All other components within normal limits  ACETAMINOPHEN  LEVEL - Abnormal; Notable for the following components:   Acetaminophen  (Tylenol ), Serum <10 (*)    All other components within normal limits  SALICYLATE LEVEL - Abnormal; Notable for the following components:   Salicylate Lvl <7.0 (*)    All other components within normal limits  ETHANOL  RAPID URINE DRUG SCREEN, HOSP PERFORMED                                                                                                                           Radiology No results found.  Pertinent labs & imaging results that were available during my care of the patient were reviewed by me and considered in my medical decision making (see MDM for details).  Medications Ordered in ED Medications  naloxone (NARCAN) nasal spray 4 mg/0.1 mL (has no administration in time range)  naloxone (NARCAN) injection 2 mg (2 mg Intravenous Given 11/08/23 1145)  sodium chloride 0.9 % bolus 1,000 mL (0 mLs Intravenous Stopped 11/08/23 1344)                                                                                                                                     Procedures .Critical Care  Performed by: Mannie Fairy DASEN, DO Authorized by: Mannie Fairy DASEN, DO   Critical care provider statement:    Critical care time (minutes):  35   Critical care was necessary to treat or prevent  imminent or life-threatening deterioration of the following conditions:  Toxidrome   Critical care was time spent personally by me on the following activities:  Development of treatment plan with patient or surrogate, discussions with consultants, evaluation of patient's response to treatment, examination of patient, ordering and review of laboratory studies, ordering and review of radiographic studies, ordering and performing treatments and interventions, pulse oximetry, re-evaluation of patient's condition and review of old charts   (including critical care time)  Medical Decision Making / ED Course   This patient presents to the ED for concern of unresponsiveness, this involves an extensive number of treatment options, and is a complaint that carries with it a high risk of complications and morbidity.  The differential diagnosis includes polysubstance use, opioid overdose, ingestion, alcohol intoxication, less likely ICH.  MDM: Patient was found by staff in the bathroom unresponsive.  I went out to the waiting room and  evaluated the patient as he was being wheeled back.  Based on his absent respirations, history and pupils, I believe this is likely opiate overdose.  Who brought the patient back to our green pod.  I instructed nursing staff to give the patient intranasal Narcan while we were establishing IV access and getting the patient placed on the monitor.  His initial blood sugar was 208.  Patient's O2 when initially placed on her monitor was 20%.  He was bagged by one of our [employer], up to 100%.  Patient was tachycardic, normotensive.  Once the IV was established, he received 2 mg of IV Narcan with immediate improvement in respirations and neurostatus.  Drug paraphernalia found in patient's clothing.  Reassessment 2:40 PM-patient's blood work reviewed.  Hyperglycemia without acidosis, creatinine roughly at baseline.  Perhaps a mild AKI.  No anemia.  Patient received a liter of IV fluids.  He was observed in the emergency room for a 3-hour period.  At this stage, patient was able to ambulate.  His lungs were clear, his vital signs were normal.  There is no evidence of pulmonary edema.  Patient appropriate for discharge.  Prescription for Narcan sent to our community pharmacy.  Patient not currently interested in buprenorphine.   Additional history obtained:  -External records from outside source obtained and reviewed including: Chart review including previous notes, labs, imaging, consultation notes   Lab Tests: -I ordered, reviewed, and interpreted labs.   The pertinent results include:   Labs Reviewed  CBC WITH DIFFERENTIAL/PLATELET - Abnormal; Notable for the following components:      Result Value   Abs Immature Granulocytes 0.08 (*)    All other components within normal limits  BASIC METABOLIC PANEL WITH GFR - Abnormal; Notable for the following components:   CO2 20 (*)    Glucose, Bld 225 (*)    Creatinine, Ser 1.27 (*)    Calcium  8.7 (*)    All other components within normal limits   ACETAMINOPHEN  LEVEL - Abnormal; Notable for the following components:   Acetaminophen  (Tylenol ), Serum <10 (*)    All other components within normal limits  SALICYLATE LEVEL - Abnormal; Notable for the following components:   Salicylate Lvl <7.0 (*)    All other components within normal limits  ETHANOL  RAPID URINE DRUG SCREEN, HOSP PERFORMED      EKG sinus tachycardia without acute ischemia  EKG Interpretation Date/Time:  Friday November 08 2023 11:41:53 EDT Ventricular Rate:  126 PR Interval:  134 QRS Duration:  88 QT Interval:  316 QTC Calculation: 458 R  Axis:   79  Text Interpretation: Sinus tachycardia Probable anteroseptal infarct, old Repol abnrm suggests ischemia, anterolateral Baseline wander in lead(s) II III aVF Confirmed by Mannie Pac [phone]) on 11/08/2023 12:15:45 PM        Medicines ordered and prescription drug management: Meds ordered this encounter  Medications   naloxone (NARCAN) injection 2 mg   naloxone (NARCAN) nasal spray 4 mg/0.1 mL   sodium chloride 0.9 % bolus 1,000 mL   naloxone (NARCAN) nasal spray 4 mg/0.1 mL    Sig: Keep next to you if you are using opiates.  If there is suspicion of overdose, spray into nostril.    Dispense:  2 each    Refill:  0    -I have reviewed the patients home medicines and have made adjustments as needed  Critical interventions Management of acute opiate overdose   Cardiac Monitoring: The patient was maintained on a cardiac monitor.  I personally viewed and interpreted the cardiac monitored which showed an underlying rhythm of: Normal sinus rhythm  Social Determinants of Health:  Factors impacting patients care include: Access to primary care   Reevaluation: After the interventions noted above, I reevaluated the patient and found that they have :improved  Co morbidities that complicate the patient evaluation  Past Medical History:  Diagnosis Date   Hypertension    Polysubstance abuse (HCC)     Tobacco abuse       Dispostion: I considered admission for this patient, however with his observation period being completed he is appropriate for discharge     Final Clinical Impression(s) / ED Diagnoses Final diagnoses:  None     @PCDICTATION @    Mannie Pac T, DO 11/08/23 1447

## 2023-11-08 NOTE — Discharge Instructions (Addendum)
 I sent you a prescription for Narcan.  You may pick this up at the pharmacy.  If you are using opiates, make sure that you have this medicine out so that people would know to give it to you.  Return to the emergency room for your healthcare needs.

## 2023-11-08 NOTE — ED Notes (Signed)
 Pt able to ambulate to restroom. Did not provide urine sample, stated he needed to do number 2

## 2023-11-08 NOTE — ED Notes (Signed)
 Pt was found in emergency room lobby restroom face down on floor by NT. Healthcare staff and security arrived to the restroom. Pt was rolled on back. Pt was unresponsive. Pt has +3 carotid pulse and pin point pupils. Pt transferred to stretcher and moved to room 3. Dr. Mannie in lobby gave verbal order to administer nasal narcan. Pt BVM and applied to monitor.

## 2023-11-08 NOTE — ED Triage Notes (Signed)
 Patient pulled from lobby bathroom, unresponsive. Brought immediately to room 3 on stretcher from waiting room. Assisting ventilations via BVM. EDP at bedside

## 2023-11-08 NOTE — ED Notes (Signed)
 Pt states unable to void at this time.

## 2023-11-11 ENCOUNTER — Other Ambulatory Visit (HOSPITAL_COMMUNITY): Payer: Self-pay

## 2023-11-11 ENCOUNTER — Other Ambulatory Visit: Payer: Self-pay

## 2024-01-04 IMAGING — DX DG CHEST 2V
2 series · 2 of 2 positions shown · non-contrast
Comparison: None.

CLINICAL DATA: Pt reports chest tightness, L arm tightness, and
generalized weakness x 2 days. Denies SOB, nausea, and vomiting

EXAM:
CHEST - 2 VIEW

[chest pa]
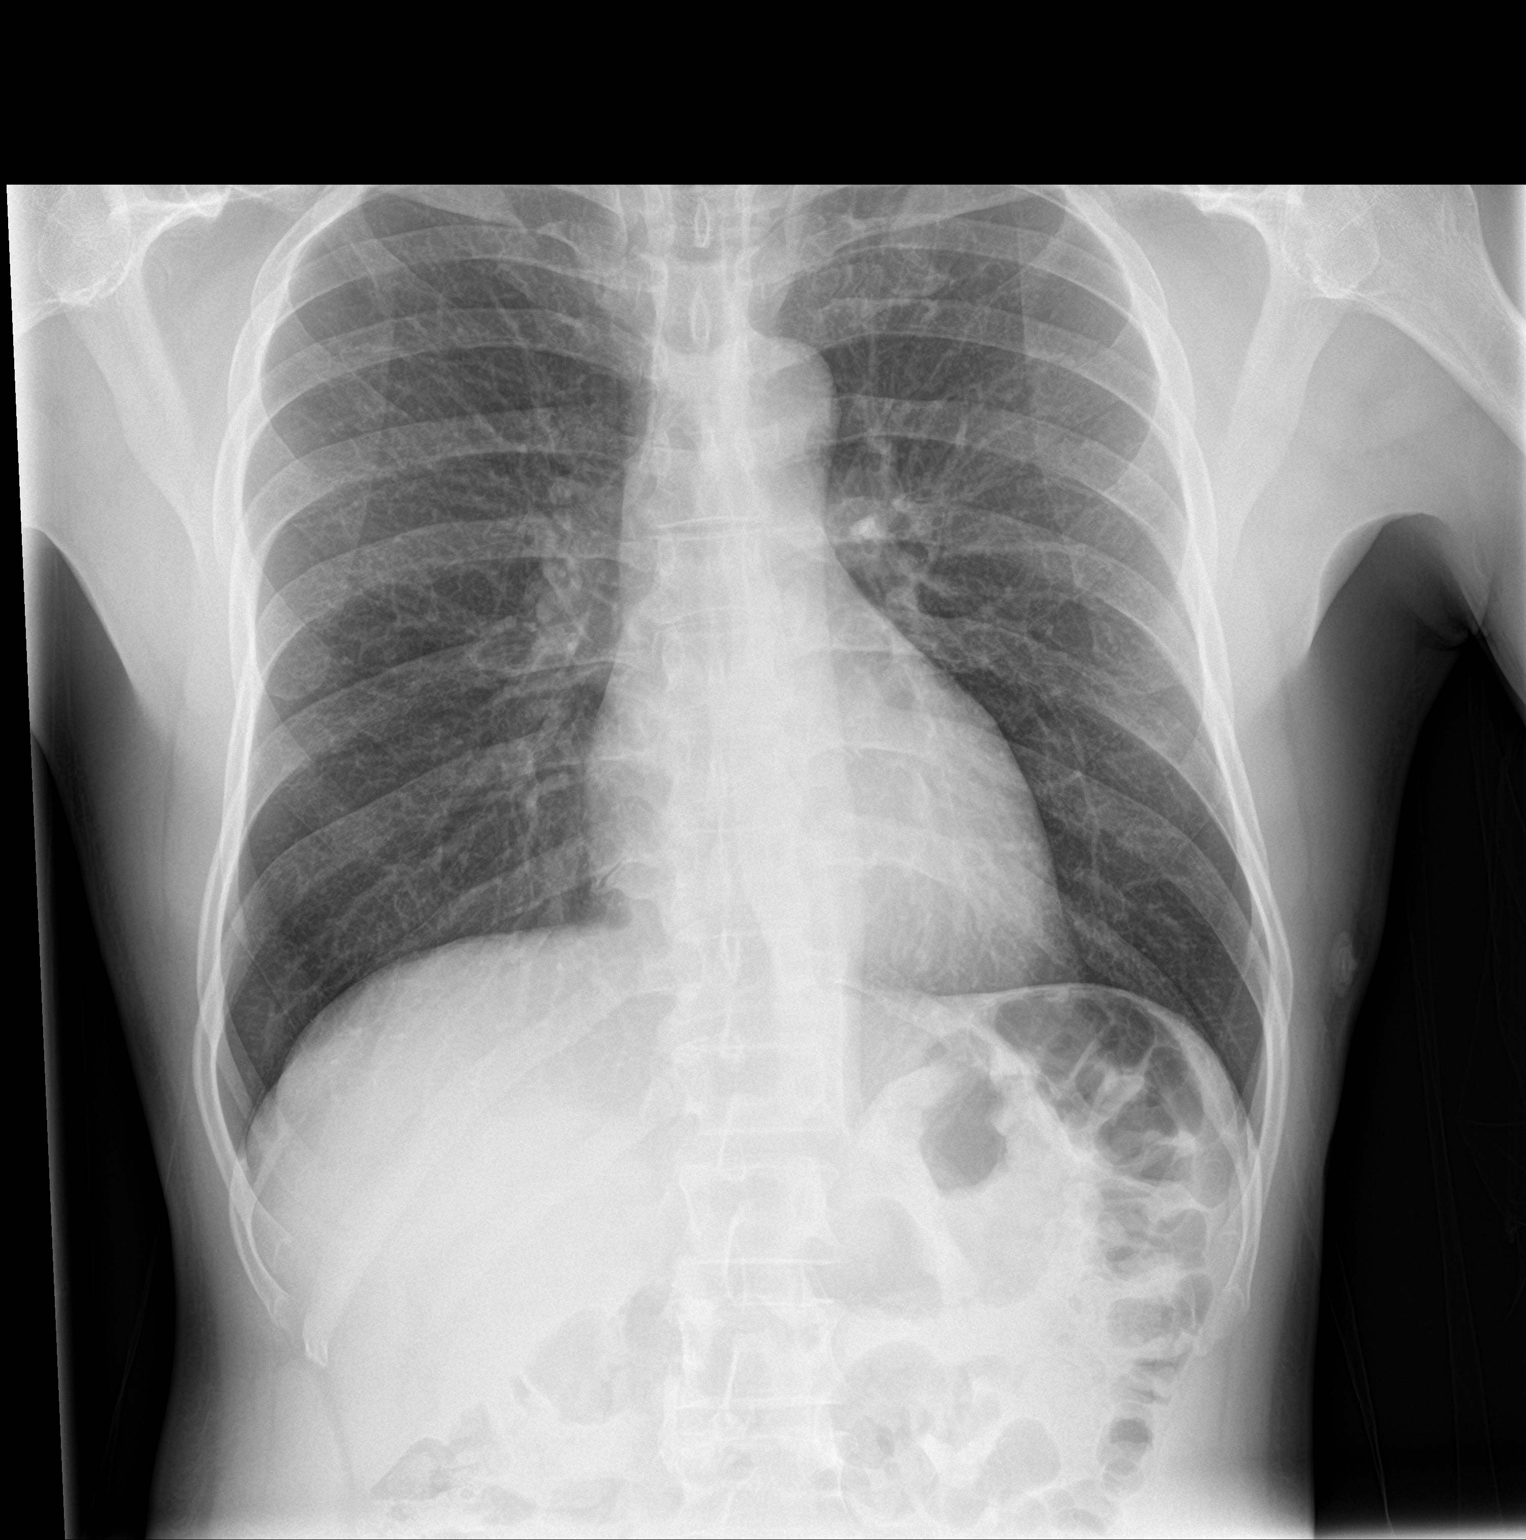

[chest lat]
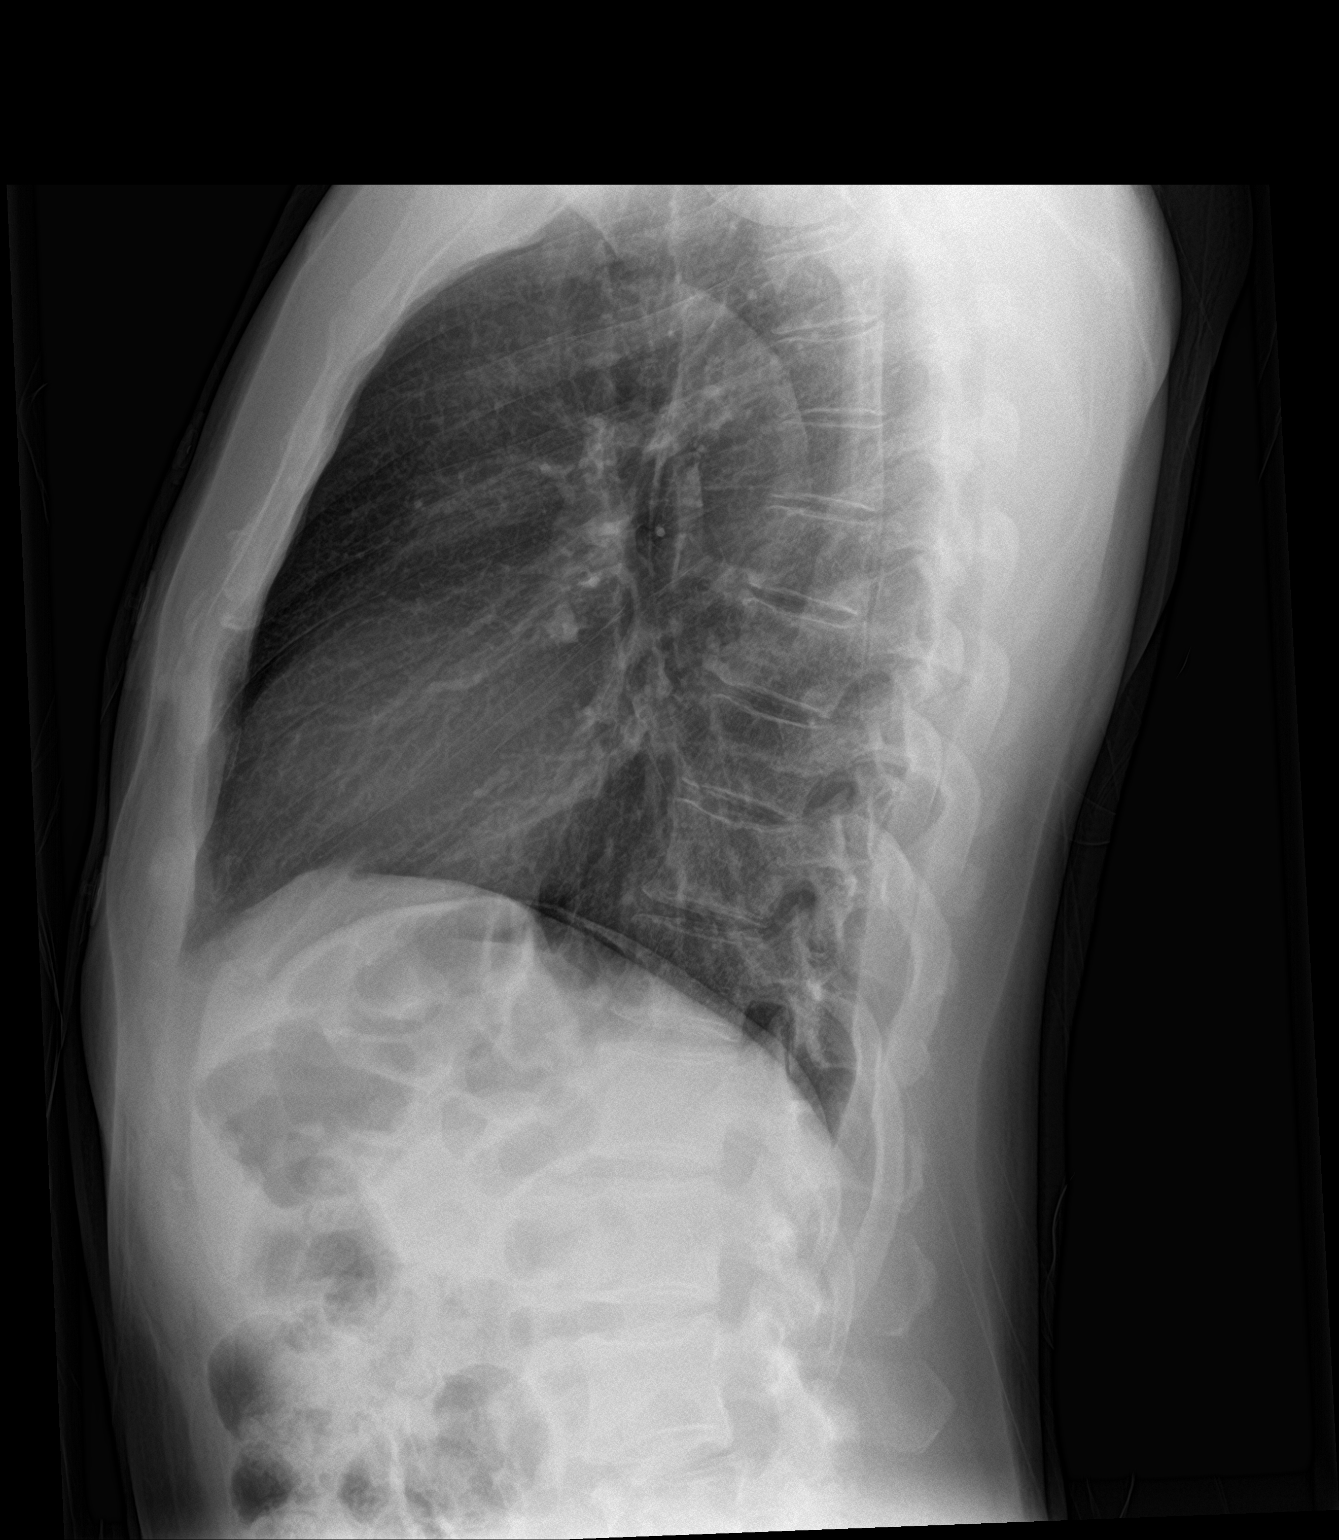

[2 of 2 positions shown; findings below may reference images not displayed]

FINDINGS: Normal heart, mediastinum and hila.

Clear lungs.  No pleural effusion or pneumothorax.

Skeletal structures are intact.
IMPRESSION: No active cardiopulmonary disease.
